# Patient Record
Sex: Male | Born: 1975 | Hispanic: Yes | Marital: Married | Smoking: Former smoker
Health system: Southern US, Community
[De-identification: ages and names within clinical notes are randomized; demographics above are authoritative.]

## PROBLEM LIST (undated history)

## (undated) DIAGNOSIS — I82409 Acute embolism and thrombosis of unspecified deep veins of unspecified lower extremity: Secondary | ICD-10-CM

## (undated) DIAGNOSIS — B019 Varicella without complication: Secondary | ICD-10-CM

## (undated) DIAGNOSIS — E785 Hyperlipidemia, unspecified: Secondary | ICD-10-CM

## (undated) DIAGNOSIS — Z86718 Personal history of other venous thrombosis and embolism: Secondary | ICD-10-CM

## (undated) DIAGNOSIS — Z9289 Personal history of other medical treatment: Secondary | ICD-10-CM

## (undated) DIAGNOSIS — J45909 Unspecified asthma, uncomplicated: Secondary | ICD-10-CM

## (undated) HISTORY — DX: Unspecified asthma, uncomplicated: J45.909

## (undated) HISTORY — DX: Varicella without complication: B01.9

## (undated) HISTORY — DX: Personal history of other venous thrombosis and embolism: Z86.718

## (undated) HISTORY — DX: Personal history of other medical treatment: Z92.89

## (undated) HISTORY — DX: Hyperlipidemia, unspecified: E78.5

## (undated) HISTORY — PX: OTHER SURGICAL HISTORY: SHX169

## (undated) NOTE — ED Provider Notes (Signed)
 Formatting of this note is different from the original.  eMERGENCY dEPARTMENT eNCOUnter    CHIEF COMPLAINT   Chief Complaint  Patient presents with  ? Animal Bite    Sting Ray Bite- happened [place] the ocean,    HPI   Eric Cunningham is a 42 y.o. male who presents with stingray envenomation to the right forearm. Patient was fishing, got a stingray. Patient was hit with tail barb in right forearm while trying to remove the hook. Patient went to 2 different urgent cares, called poison control, and spoke with a park ranger, before coming here. Injury occurred approximately 1.5 hour ago. None tetanus status. Patient is right-hand dominant.  PAST MEDICAL HISTORY   Past Medical History  Diagnosis Date  ? DVT (deep venous thrombosis)    SURGICAL HISTORY   No past surgical history on file.  CURRENT MEDICATIONS   Current Facility-Administered Medications  Medication Dose Route Frequency Provider Last Rate Last Dose  ? oxyCODONE -acetaminophen  (PERCOCET) 5-325 mg per tablet 1 tablet  1 tablet Oral Once Sean P O'Brien, MD      ? TDaP (ADACEL) 2-5-3-5-5 Lf-mcg-Lf/0.67mL injection 0.5 mL  0.5 mL Intramuscular Once Sean P O'Brien, MD       No current outpatient prescriptions on file.   ALLERGIES   Allergies  Allergen Reactions  ? Pcn [Penicillins]    FAMILY HISTORY   No family history on file.  SOCIAL HISTORY   History   Social History  ? Marital Status: N/A    Spouse Name: N/A    Number of Children: N/A  ? Years of Education: N/A   Social History Main Topics  ? Smoking status: Never Smoker   ? Smokeless tobacco: Not on file  ? Alcohol Use: No  ? Drug Use: Yes    Special: Marijuana  ? Sexual Activity: Not on file   Other Topics Concern  ? Not on file   Social History Narrative  ? No narrative on file   family and children are at the bedside. They are here camping  REVIEW OF SYSTEMS   Constitutional:  Denies weakness or lightheadedness Eyes:  Denies visual changes or pain.   HENT:  Denies facial, oral or dental pain or injury. Respiratory:  Denies shortness of breath or cough Cardiovascular:  Denies chest pain  GI:  Denies abdominal pain, nausea or vomiting, or incontinence Musculoskeletal:  Pain in right forearm around site Skin: Puncture wound right forearm Neurologic:  Denies headache, focal weakness or sensory changes.   Psychiatric:  Denies depression, intentional injury, suicidal ideation or homicidal ideation.    See HPI for further details ROS otherwise negative No old records available  PHYSICAL EXAM   VITAL SIGNS: BP 131/76 mmHg  Pulse 68  Temp(Src) 98 F (36.7 C) (Oral)  Resp 12  SpO2 100% Constitutional:  Well-developed well-nourished male, appears uncomfortable Head : Normocephalic/atraumatic Neck: Supple. Trachea midline, no crepitus, no cervical step-offs Eyes:  2+ equal round reactive to light, extraocular muscles appear intact HENT:   No dental/oral inj, airway normal Respiratory:  Clear to auscultation bilaterally, no wheezing Cardiovascular:  regular rhythm without murmurs, rubs or gallops, 2+/2+ rad & DP pulses GI:  NTND, no ecchymosis or abrasions Musculoskeletal:  Full range of motion in the right upper extremity. No bony tenderness. Skin:  warm & dry, normal color, puncture wound right forearm, no surrounding erythema or purulence Neurologic:  GCS 15, no identified CN abnormalities, sensation and motor nl x 4, mood/affect nl  RADIOLOGY  Reviewed by me. See official radiology interpretation. X-ray Radius Ulna Right Ap And Lateral  01/23/2014   TWO VIEW RIGHT FOREARM:  COMPARISON: No comparison.  FINDINGS:  Soft tissue swelling is seen. No radiopaque foreign body is seen. No fracture or dislocation is seen.   01/23/2014   IMPRESSION:  SOFT TISSUE SWELLING  Dictated By: FORBES Alm Sar, MD 01/23/2014 8:55 PM  Electronically Signed by: FORBES Alm Sar, MD 01/23/2014 8:56 PM  PROCEDURES   Hot water immersion, that she  ED COURSE &  MEDICAL DECISION MAKING   Pertinent labs & imaging studies reviewed. (See chart for details) Patient suffered a stingray envenomation. Unfortunately no one told him to soak the area in hot water. Patient was transferred to sink where he could soak the wound. Patient given pain medication. Tetanus updated. X-ray obtained to ensure no foreign bodies retained.  FINAL IMPRESSION   Stingray envenomation  Portions of this note may be dictated using Dragon Naturally Speaking voice recognition software. Variances in spelling and vocabulary are possible and unintentional. Not all errors are caught/corrected. Please notify the dino if any discrepancies are noted or if the meaning of any statement is not clear.  Alyce SHAUNNA Rummer, MD 01/23/14 2127 Electronically signed by Alyce SHAUNNA Rummer, MD at 01/23/2014  9:27 PM EDT

---

## 2008-07-25 DIAGNOSIS — Z86718 Personal history of other venous thrombosis and embolism: Secondary | ICD-10-CM

## 2008-07-25 HISTORY — PX: OTHER SURGICAL HISTORY: SHX169

## 2008-07-25 HISTORY — DX: Personal history of other venous thrombosis and embolism: Z86.718

## 2015-05-28 ENCOUNTER — Inpatient Hospital Stay: Payer: Medicaid Other | Attending: Oncology | Admitting: Oncology

## 2015-05-28 ENCOUNTER — Encounter: Payer: Self-pay | Admitting: Oncology

## 2015-05-28 ENCOUNTER — Inpatient Hospital Stay: Payer: Medicaid Other

## 2015-05-28 VITALS — BP 104/70 | HR 76 | Temp 96.8°F | Resp 16 | Ht 66.93 in | Wt 203.5 lb

## 2015-05-28 DIAGNOSIS — Z86718 Personal history of other venous thrombosis and embolism: Secondary | ICD-10-CM | POA: Insufficient documentation

## 2015-05-28 DIAGNOSIS — F17210 Nicotine dependence, cigarettes, uncomplicated: Secondary | ICD-10-CM | POA: Diagnosis not present

## 2015-05-28 DIAGNOSIS — I82402 Acute embolism and thrombosis of unspecified deep veins of left lower extremity: Secondary | ICD-10-CM

## 2015-05-28 LAB — ANTITHROMBIN III: AntiThromb III Func: 113 % (ref 75–120)

## 2015-05-28 NOTE — Progress Notes (Signed)
Patient had left leg DVT 6 years ago after a long road trip and would like furrther work up, has not had another DVT since.  He does have family history of DVT with his mother and uncle.

## 2015-06-02 LAB — LUPUS ANTICOAGULANT PANEL
DRVVT: 31.5 s (ref 0.0–44.0)
PTT Lupus Anticoagulant: 30.4 s (ref 0.0–40.6)

## 2015-06-02 LAB — CARDIOLIPIN ANTIBODIES, IGG, IGM, IGA
Anticardiolipin IgA: <9 APL U/mL (ref 0–11)
Anticardiolipin IgG: <9 GPL U/mL (ref 0–14)

## 2015-06-02 LAB — BETA-2-GLYCOPROTEIN I ABS, IGG/M/A
Beta-2 Glyco I IgG: <9 GPI IgG units (ref 0–20)
Beta-2-Glycoprotein I IgM: <9 GPI IgM units (ref 0–32)

## 2015-06-02 LAB — PROTEIN S, TOTAL: PROTEIN S AG TOTAL: 72 % (ref 60–150)

## 2015-06-02 LAB — PROTHROMBIN GENE MUTATION

## 2015-06-02 LAB — PROTEIN C ACTIVITY: PROTEIN C ACTIVITY: 142 % (ref 73–180)

## 2015-06-02 LAB — PROTEIN S ACTIVITY: PROTEIN S ACTIVITY: 45 % — AB (ref 63–140)

## 2015-06-02 LAB — FACTOR 5 LEIDEN

## 2015-06-02 LAB — HOMOCYSTEINE: HOMOCYSTEINE-NORM: 9 umol/L (ref 0.0–15.0)

## 2015-06-02 LAB — PROTEIN C, TOTAL: PROTEIN C, TOTAL: 115 % (ref 60–150)

## 2015-06-05 NOTE — Progress Notes (Signed)
[name]  Telephone:([phone]) [phone] Fax:(336) [phone]  ID: Eric Cunningham OB: [birth year]  MR#: [id number]  [id number]  Patient Care Team: Eric Finner, NP as PCP - General (Nurse Practitioner)  CHIEF COMPLAINT:  Chief Complaint  Patient presents with  . New Evaluation    hematology    INTERVAL HISTORY: Patient is a 39 year old male who had a left leg DVT in 2010 in [place] after a long road trip. He required a thrombectomy and received Lovenox and warfarin at that time. He does not recall if he ever had a full hypercoagulable workup. He has had no other DVT or clotting episodes. His only family history is of his mother who had a DVT 1-2 weeks postpartum. Currently, he feels well and is asymptomatic. He offers no specific complaints.   REVIEW OF SYSTEMS:   Review of Systems  Constitutional: Negative for fever, weight loss and malaise/fatigue.  Respiratory: Negative.   Cardiovascular: Negative.   Gastrointestinal: Negative.   Musculoskeletal: Negative.   Neurological: Negative.  Negative for weakness.  Endo/Heme/Allergies: Does not bruise/bleed easily.  Psychiatric/Behavioral: Negative.     As per HPI. Otherwise, a complete review of systems is negatve.  PAST MEDICAL HISTORY: Past Medical History  Diagnosis Date  . History of DVT (deep vein thrombosis) 2010    PAST SURGICAL HISTORY: Past Surgical History  Procedure Laterality Date  . Removal of dvt Left     FAMILY HISTORY No family history on file.     ADVANCED DIRECTIVES:    HEALTH MAINTENANCE: Social History  Substance Use Topics  . Smoking status: Light Tobacco Smoker    Types: Cigars  . Smokeless tobacco: Never Used  . Alcohol Use: Yes     Comment: occasional     Colonoscopy:  PAP:  Bone density:  Lipid panel:  Allergies  Allergen Reactions  . Ativan [Lorazepam]   . Penicillins     No current outpatient prescriptions on file.   No current  facility-administered medications for this visit.    OBJECTIVE: Filed Vitals:   05/28/15 1517  BP: 104/70  Pulse: 76  Temp: 96.8 F (36 C)  Resp: 16     Body mass index is 31.94 kg/(m^2).    ECOG FS:0 - Asymptomatic  General: Well-developed, well-nourished, no acute distress. Eyes: Pink conjunctiva, anicteric sclera. HEENT: Normocephalic, moist mucous membranes, clear oropharnyx. Lungs: Clear to auscultation bilaterally. Heart: Regular rate and rhythm. No rubs, murmurs, or gallops. Abdomen: Soft, nontender, nondistended. No organomegaly noted, normoactive bowel sounds. Musculoskeletal: No edema, cyanosis, or clubbing. Neuro: Alert, answering all questions appropriately. Cranial nerves grossly intact. Skin: No rashes or petechiae noted. Psych: Normal affect. Lymphatics: No cervical, calvicular, axillary or inguinal LAD.   LAB RESULTS:  No results found for: NA, K, CL, CO2, GLUCOSE, BUN, CREATININE, CALCIUM, PROT, ALBUMIN, AST, ALT, ALKPHOS, BILITOT, GFRNONAA, GFRAA  No results found for: WBC, NEUTROABS, HGB, HCT, MCV, PLT   STUDIES: No results found.  ASSESSMENT:  History of DVT.  PLAN:    1. History of DVT: Full hypercoagulable workup is negative. Patient's DVT in 2010 was likely secondary to his extended road trip. His mother's DVT was likely secondary to her pregnancy. No intervention is needed at this time. Patient does not require anticoagulation. No follow-up is necessary.  Patient expressed understanding and was in agreement with this plan. He also understands that He can call clinic at any time with any questions, concerns, or complaints.    Lloyd Huger, MD   06/05/2015  4:51 PM

## 2016-01-16 ENCOUNTER — Emergency Department (HOSPITAL_COMMUNITY): Payer: Self-pay

## 2016-01-16 ENCOUNTER — Emergency Department (HOSPITAL_COMMUNITY)
Admission: EM | Admit: 2016-01-16 | Discharge: 2016-01-16 | Disposition: A | Discharge status: Home / Self Care | Payer: Self-pay | Attending: Emergency Medicine | Admitting: Emergency Medicine

## 2016-01-16 ENCOUNTER — Encounter (HOSPITAL_COMMUNITY): Payer: Self-pay

## 2016-01-16 DIAGNOSIS — Y929 Unspecified place or not applicable: Secondary | ICD-10-CM | POA: Insufficient documentation

## 2016-01-16 DIAGNOSIS — S93402A Sprain of unspecified ligament of left ankle, initial encounter: Secondary | ICD-10-CM | POA: Insufficient documentation

## 2016-01-16 DIAGNOSIS — Z79899 Other long term (current) drug therapy: Secondary | ICD-10-CM | POA: Insufficient documentation

## 2016-01-16 DIAGNOSIS — Y999 Unspecified external cause status: Secondary | ICD-10-CM | POA: Insufficient documentation

## 2016-01-16 DIAGNOSIS — F17210 Nicotine dependence, cigarettes, uncomplicated: Secondary | ICD-10-CM | POA: Insufficient documentation

## 2016-01-16 DIAGNOSIS — X58XXXA Exposure to other specified factors, initial encounter: Secondary | ICD-10-CM | POA: Insufficient documentation

## 2016-01-16 DIAGNOSIS — Y939 Activity, unspecified: Secondary | ICD-10-CM | POA: Insufficient documentation

## 2016-01-16 HISTORY — DX: Acute embolism and thrombosis of unspecified deep veins of unspecified lower extremity: I82.409

## 2016-01-16 MED ORDER — NAPROXEN 500 MG PO TABS: 500.0000 mg | ORAL_TABLET | Freq: Two times a day (BID) | ORAL | Status: DC

## 2016-01-16 MED ORDER — OXYCODONE-ACETAMINOPHEN 5-325 MG PO TABS: 1.0000 | ORAL_TABLET | Freq: Four times a day (QID) | ORAL | Status: DC | PRN

## 2016-01-16 NOTE — ED Provider Notes (Signed)
CSN: [id number]     Arrival date & time 01/16/16  1926 History  By signing my name below, I, Soijett Blue, attest that this documentation has been prepared under the direction and in the presence of Shawn Joy, PA-C Electronically Signed: Soijett Blue, ED Scribe. 01/16/2016. 8:00 PM.   Chief Complaint  Patient presents with  . Ankle Injury      The history is provided by the patient. No language interpreter was used.    Eric Cunningham is a 40 y.o. male who presents to the Emergency Department complaining of left ankle injury occurring PTA. Pt reports that he rolled his ankle while running on the grass in the rain. Pt notes that he heard a "snap" during the incident to his left ankle. Pt states that following the incident, there was pain that he describes as a "numb pain." Pt reports that his left ankle pain is alleviated with sitting still and worsened with weight bearing/ambulation. Pt describes his left ankle pain as dull at this time. Pt is having associated symptoms of left ankle swelling and gait problem due to pain. He notes that he has not tried any medications for the relief of his symptoms. He denies color change, wound, neck pain, back pain, head trauma, and any other symptoms.    Past Medical History  Diagnosis Date  . History of DVT (deep vein thrombosis) 2010  . DVT (deep venous thrombosis) [facility])    Past Surgical History  Procedure Laterality Date  . Removal of dvt Left    No family history on file. Social History  Substance Use Topics  . Smoking status: Light Tobacco Smoker    Types: Cigars  . Smokeless tobacco: Never Used  . Alcohol Use: Yes     Comment: occasional    Review of Systems  Musculoskeletal: Positive for joint swelling (left ankle), arthralgias (left ankle) and gait problem (due to pain). Negative for back pain and neck pain.  Skin: Negative for color change, rash and wound.      Allergies  Ativan and Penicillins  Home Medications   Prior  to Admission medications   Medication Sig Start Date End Date Taking? Authorizing Provider  naproxen (NAPROSYN) 500 MG tablet Take 1 tablet (500 mg total) by mouth 2 (two) times daily. 01/16/16   Shawn C Joy, PA-C  oxyCODONE-acetaminophen (PERCOCET/ROXICET) 5-325 MG tablet Take 1 tablet by mouth every 6 (six) hours as needed for severe pain. 01/16/16   Shawn C Joy, PA-C   BP 136/78 mmHg  Pulse 76  Temp(Src) 98.6 F (37 C) (Oral)  Resp 18  SpO2 98% Physical Exam  Constitutional: He appears well-developed and well-nourished.  HENT:  Head: Normocephalic and atraumatic.  Eyes: Conjunctivae are normal.  Neck: Neck supple.  Cardiovascular: Normal rate and intact distal pulses.   Pulmonary/Chest: Effort normal.  Abdominal: Soft. He exhibits no distension.  Musculoskeletal: Normal range of motion.  Swelling over the left lateral malleolus. Distal pulses intact.  Neurological: He is alert.  No sensory deficits. Strength 5 out of 5.  Skin: Skin is warm and dry.  Psychiatric: He has a normal mood and affect. His behavior is normal.  Nursing note and vitals reviewed.   ED Course  Procedures (including critical care time) DIAGNOSTIC STUDIES: Oxygen Saturation is 98% on RA, nl by my interpretation.    COORDINATION OF CARE: 8:00 PM Discussed treatment plan with pt at bedside which includes left ankle xray, ice, referral to orthopedist, and pt agreed to plan.  Imaging Review Dg Ankle Complete Left  01/16/2016  CLINICAL DATA:  40 year old male with a history of ankle injury EXAM: LEFT ANKLE COMPLETE - 3+ VIEW COMPARISON:  None. FINDINGS: No acute fracture identified. No significant soft tissue swelling. Ankle mortise appears congruent. No evidence of joint effusion on the lateral view. No radiopaque foreign body. IMPRESSION: Negative for acute bony abnormality. Signed, Dulcy Fanny. Earleen Newport, DO [facility] Radiology Electronically Signed   By: Corrie Mckusick D.O.   On: 01/16/2016 20:10   I have personally reviewed and evaluated these images as part of my medical decision-making.   EKG Interpretation None      MDM   Final diagnoses:  Ankle sprain, left, initial encounter    Patient X-Ray negative for fracture or dislocation. Suspect ankle sprain. Pt advised to follow up with orthopedics. Patient given brace and crutches while in ED, conservative therapy recommended and discussed. Patient will be discharged home & is agreeable with above plan. Return precautions discussed. Pt appears safe for discharge.   I personally performed the services described in this documentation, which was scribed in my presence. The recorded information has been reviewed and is accurate.   Lorayne Bender, PA-C 01/16/16 2023  Fredia Sorrow, MD 01/20/16 1651

## 2016-01-16 NOTE — Discharge Instructions (Signed)
You have been seen today for an ankle injury. Your imaging showed no abnormalities. Follow up with orthopedics should symptoms fail to resolve. Refer to the attached literature for home care. Follow up with PCP as needed.

## 2016-01-16 NOTE — ED Notes (Addendum)
Patient here with left ankle pain after rolling same while running in rain. No obvious deformity. Some lateral swelling noted to ankle and top of foot

## 2016-01-16 NOTE — Progress Notes (Signed)
Orthopedic Tech Progress Note Patient Details:  Eric Cunningham 1976/01/09 [id number]  Ortho Devices Type of Ortho Device: ASO, Crutches Ortho Device/Splint Location: LLE ankle Ortho Device/Splint Interventions: Ordered, Application   Braulio Bosch 01/16/2016, 8:35 PM

## 2016-01-16 NOTE — ED Notes (Signed)
Ortho paged and states they are coming.

## 2016-04-23 ENCOUNTER — Inpatient Hospital Stay
Admission: EM | Admit: 2016-04-23 | Discharge: 2016-04-27 | DRG: 254 | Disposition: A | Discharge status: Home / Self Care | Payer: Medicaid Other | Attending: Internal Medicine | Admitting: Internal Medicine

## 2016-04-23 ENCOUNTER — Encounter: Payer: Self-pay | Admitting: Emergency Medicine

## 2016-04-23 ENCOUNTER — Encounter: Payer: Self-pay | Admitting: Gynecology

## 2016-04-23 ENCOUNTER — Ambulatory Visit (INDEPENDENT_AMBULATORY_CARE_PROVIDER_SITE_OTHER)
Admission: EM | Admit: 2016-04-23 | Discharge: 2016-04-23 | Disposition: A | Discharge status: Home / Self Care | Payer: Medicaid Other | Source: Home / Self Care | Attending: Family Medicine | Admitting: Family Medicine

## 2016-04-23 ENCOUNTER — Emergency Department: Payer: Medicaid Other

## 2016-04-23 DIAGNOSIS — I2699 Other pulmonary embolism without acute cor pulmonale: Secondary | ICD-10-CM | POA: Diagnosis not present

## 2016-04-23 DIAGNOSIS — M7989 Other specified soft tissue disorders: Secondary | ICD-10-CM | POA: Diagnosis not present

## 2016-04-23 DIAGNOSIS — M79605 Pain in left leg: Secondary | ICD-10-CM | POA: Diagnosis present

## 2016-04-23 DIAGNOSIS — I82412 Acute embolism and thrombosis of left femoral vein: Secondary | ICD-10-CM

## 2016-04-23 DIAGNOSIS — Z86711 Personal history of pulmonary embolism: Secondary | ICD-10-CM | POA: Diagnosis not present

## 2016-04-23 DIAGNOSIS — Z86718 Personal history of other venous thrombosis and embolism: Secondary | ICD-10-CM

## 2016-04-23 DIAGNOSIS — F17290 Nicotine dependence, other tobacco product, uncomplicated: Secondary | ICD-10-CM | POA: Diagnosis present

## 2016-04-23 DIAGNOSIS — I82402 Acute embolism and thrombosis of unspecified deep veins of left lower extremity: Secondary | ICD-10-CM | POA: Diagnosis not present

## 2016-04-23 DIAGNOSIS — F17210 Nicotine dependence, cigarettes, uncomplicated: Secondary | ICD-10-CM | POA: Diagnosis not present

## 2016-04-23 DIAGNOSIS — I82401 Acute embolism and thrombosis of unspecified deep veins of right lower extremity: Secondary | ICD-10-CM | POA: Diagnosis not present

## 2016-04-23 LAB — BASIC METABOLIC PANEL
ANION GAP: 7 (ref 5–15)
BUN: 16 mg/dL (ref 6–20)
CALCIUM: 9.1 mg/dL (ref 8.9–10.3)
CO2: 23 mmol/L (ref 22–32)
Chloride: 106 mmol/L (ref 101–111)
Creatinine, Ser: 0.67 mg/dL (ref 0.61–1.24)
GLUCOSE: 100 mg/dL — AB (ref 65–99)
Potassium: 3.7 mmol/L (ref 3.5–5.1)
SODIUM: 136 mmol/L (ref 135–145)

## 2016-04-23 LAB — CBC
HEMATOCRIT: 43.5 % (ref 40.0–52.0)
Hemoglobin: 14.7 g/dL (ref 13.0–18.0)
MCH: 29.5 pg (ref 26.0–34.0)
MCHC: 33.8 g/dL (ref 32.0–36.0)
MCV: 87.3 fL (ref 80.0–100.0)
PLATELETS: 271 10*3/uL (ref 150–440)
RBC: 4.99 MIL/uL (ref 4.40–5.90)
RDW: 14.4 % (ref 11.5–14.5)
WBC: 9.2 10*3/uL (ref 3.8–10.6)

## 2016-04-23 LAB — PROTIME-INR
INR: 0.92
Prothrombin Time: 12.4 seconds (ref 11.4–15.2)

## 2016-04-23 LAB — APTT: APTT: 26 s (ref 24–36)

## 2016-04-23 MED ORDER — HEPARIN BOLUS VIA INFUSION
5000.0000 [IU] | Freq: Once | INTRAVENOUS | Status: AC
  Administered 2016-04-23: 5000 [IU] via INTRAVENOUS
  Filled 2016-04-23: qty 5000

## 2016-04-23 MED ORDER — HEPARIN (PORCINE) IN NACL 100-0.45 UNIT/ML-% IJ SOLN
1500.0000 [IU]/h | INTRAMUSCULAR | Status: DC
  Administered 2016-04-23 – 2016-04-25 (×4): 1500 [IU]/h via INTRAVENOUS
  Filled 2016-04-23 (×5): qty 250

## 2016-04-23 MED ORDER — ONDANSETRON HCL 4 MG PO TABS: 4.0000 mg | ORAL_TABLET | Freq: Four times a day (QID) | ORAL | Status: DC | PRN

## 2016-04-23 MED ORDER — ACETAMINOPHEN 650 MG RE SUPP: 650.0000 mg | Freq: Four times a day (QID) | RECTAL | Status: DC | PRN

## 2016-04-23 MED ORDER — HYDROCODONE-ACETAMINOPHEN 5-325 MG PO TABS: 1.0000 | ORAL_TABLET | ORAL | Status: DC | PRN

## 2016-04-23 MED ORDER — SENNOSIDES-DOCUSATE SODIUM 8.6-50 MG PO TABS: 1.0000 | ORAL_TABLET | Freq: Every evening | ORAL | Status: DC | PRN

## 2016-04-23 MED ORDER — ACETAMINOPHEN 325 MG PO TABS: 650.0000 mg | ORAL_TABLET | Freq: Four times a day (QID) | ORAL | Status: DC | PRN

## 2016-04-23 MED ORDER — ONDANSETRON HCL 4 MG/2ML IJ SOLN: 4.0000 mg | Freq: Four times a day (QID) | INTRAMUSCULAR | Status: DC | PRN

## 2016-04-23 MED ORDER — SODIUM CHLORIDE 0.9 % IV SOLN
INTRAVENOUS | Status: DC
  Administered 2016-04-23: 21:00:00 via INTRAVENOUS

## 2016-04-23 MED ORDER — SODIUM CHLORIDE 0.9% FLUSH
3.0000 mL | Freq: Two times a day (BID) | INTRAVENOUS | Status: DC
  Administered 2016-04-23 – 2016-04-27 (×4): 3 mL via INTRAVENOUS

## 2016-04-23 NOTE — ED Triage Notes (Signed)
Patient c/o DVT in left leg.

## 2016-04-23 NOTE — ED Triage Notes (Signed)
Pt c/o pain and swelling to left leg X 2 days. Hx DVT. Travel to [place] within last week. Denies CP/SHOB

## 2016-04-23 NOTE — ED Notes (Signed)
Report given to floor RN. Pt taken to floor via stretcher. Vital signs stable prior to transport.  

## 2016-04-23 NOTE — ED Provider Notes (Signed)
[facility] Emergency Department Provider Note   ____________________________________________    I have reviewed the triage vital signs and the nursing notes.   HISTORY  Chief Complaint Leg Swelling     HPI Eric Cunningham is a 40 y.o. male who presents with complaints of left leg swelling and pain. Patient reports recent trip to [place] where he drove 10 hours there and back. He developed swelling and discomfort behind his left knee since thereafter. Patient has a history of a DVT 8 years ago which required thrombectomy hospital stay in ICU stay. He was never on Coumadin afterwards because of insurance issues. He denies shortness of breath. Denies pleurisy. Denies chest pain. Denies discoloration of his leg.   Past Medical History:  Diagnosis Date  . DVT (deep venous thrombosis) ([place])   . History of DVT (deep vein thrombosis) 2010    Patient Active Problem List   Diagnosis Date Noted  . DVT (deep vein thrombosis) in pregnancy 04/23/2016    Past Surgical History:  Procedure Laterality Date  . removal of dvt Left     Prior to Admission medications   Not on File     Allergies Ativan [lorazepam] and Penicillins  History reviewed. No pertinent family history.  Social History Social History  Substance Use Topics  . Smoking status: Light Tobacco Smoker    Types: Cigars  . Smokeless tobacco: Never Used  . Alcohol use Yes     Comment: occasional    Review of Systems  Constitutional: No fever/chills  Cardiovascular: Denies chest pain. Respiratory: Denies shortness of breath.No pleurisy  Gastrointestinal:   No nausea, no vomiting.    Musculoskeletal: Leg pain as above Skin: Negative for pallor Neurological: Negative for headaches   10-point ROS otherwise negative.  ____________________________________________   PHYSICAL EXAM:  VITAL SIGNS: ED Triage Vitals  Enc Vitals Group     BP 04/23/16 1722 129/82     Pulse  Rate 04/23/16 1722 75     Resp 04/23/16 1722 18     Temp 04/23/16 1722 98.1 F (36.7 C)     Temp Source 04/23/16 1722 Oral     SpO2 04/23/16 1722 96 %     Weight 04/23/16 1723 207 lb (93.9 kg)     Height 04/23/16 1723 5\' 6"  (1.676 m)     Head Circumference --      Peak Flow --      Pain Score 04/23/16 1723 3     Pain Loc --      Pain Edu? --      Excl. in [place]? --     Constitutional: Alert and oriented. No acute distress. Pleasant and interactive   Nose: No congestion/rhinnorhea. Mouth/Throat: Mucous membranes are moist.    Cardiovascular: Normal rate, regular rhythm. Grossly normal heart sounds.  Good peripheral circulation. Respiratory: Normal respiratory effort.  No retractions. Lungs CTAB. Gastrointestinal: Soft and nontender. No distention.  No CVA tenderness. Genitourinary: deferred Musculoskeletal: 2+ distal pulses in the lower extremities. Warm and well perfused. No pallor or discoloration, minimal swelling in the ankle Neurologic:  Normal speech and language. No gross focal neurologic deficits are appreciated.  Skin:  Skin is warm, dry and intact. No rash noted. Psychiatric: Mood and affect are normal. Speech and behavior are normal.  ____________________________________________   LABS (all labs ordered are listed, but only abnormal results are displayed)  Labs Reviewed  CBC  BASIC METABOLIC PANEL  PROTIME-INR  APTT   ____________________________________________  EKG  None ____________________________________________  RADIOLOGY  Significant DVT in the left leg ____________________________________________   PROCEDURES  Procedure(s) performed: No    Critical Care performed: No ____________________________________________   INITIAL IMPRESSION / ASSESSMENT AND PLAN / ED COURSE  Pertinent labs & imaging results that were available during my care of the patient were reviewed by me and considered in my medical decision making (see chart for  details).  Discussed with Dr. Delana Meyer of vascular surgery, he recommends starting heparin drip and admitted to the hospital as he feels the patient will likely require procedure given his age and history. Discussed this with the patient who understands the plan.  Clinical Course   ____________________________________________   FINAL CLINICAL IMPRESSION(S) / ED DIAGNOSES  Final diagnoses:  Acute deep vein thrombosis (DVT) of femoral vein of left lower extremity (HCC)      NEW MEDICATIONS STARTED DURING THIS VISIT:  New Prescriptions   No medications on file     Note:  This document was prepared using Dragon voice recognition software and may include unintentional dictation errors.    Lavonia Drafts, MD 04/23/16 [phone]

## 2016-04-23 NOTE — H&P (Signed)
[facility] NAME: Eric Cunningham    MR#:  [id number]  DATE OF BIRTH:  1975-11-26  DATE OF ADMISSION:  04/23/2016  PRIMARY CARE PHYSICIAN: Freddy Finner, NP   REQUESTING/REFERRING PHYSICIAN: Dr Corky Downs     CHIEF COMPLAINT:   Left leg pain HISTORY OF PRESENT ILLNESS:  Eric Cunningham  is a 40 y.o. male with a known history of DVT 8 years ago who presents with left lower external he pain. Patient reports that he was on a [place] over the weekend and subsequently has developed pain and swelling of the left lower external he. In the emergency room Doppler shows extensive DVT. Vascular surgery was consulted who is planning for thrombectomy early this week. They have recommended heparin drip for now. Patient had thrombectomy for his last DVT as well. PAST MEDICAL HISTORY:   Past Medical History:  Diagnosis Date  . DVT (deep venous thrombosis) ([place])   . History of DVT (deep vein thrombosis) 2010    PAST SURGICAL HISTORY:   Past Surgical History:  Procedure Laterality Date  . removal of dvt Left     SOCIAL HISTORY:   Social History  Substance Use Topics  . Smoking status: Light Tobacco Smoker    Types: Cigars  . Smokeless tobacco: Never Used  . Alcohol use Yes     Comment: occasional    FAMILY HISTORY:  Mother has history of DVT after pregnancy  DRUG ALLERGIES:   Allergies  Allergen Reactions  . Ativan [Lorazepam]   . Penicillins     REVIEW OF SYSTEMS:   Review of Systems  Constitutional: Negative.  Negative for chills, fever and malaise/fatigue.  HENT: Negative.  Negative for ear discharge, ear pain, hearing loss, nosebleeds and sore throat.   Eyes: Negative.  Negative for blurred vision and pain.  Respiratory: Negative.  Negative for cough, hemoptysis, shortness of breath and wheezing.   Cardiovascular: Positive for leg swelling. Negative for chest pain and palpitations.  Gastrointestinal:  Negative.  Negative for abdominal pain, blood in stool, diarrhea, nausea and vomiting.  Genitourinary: Negative.  Negative for dysuria.  Musculoskeletal: Negative.  Negative for back pain.  Skin: Negative.   Neurological: Negative for dizziness, tremors, speech change, focal weakness, seizures and headaches.  Endo/Heme/Allergies: Negative.  Does not bruise/bleed easily.  Psychiatric/Behavioral: Negative.  Negative for depression, hallucinations and suicidal ideas.    MEDICATIONS AT HOME:   Prior to Admission medications   Not on File      VITAL SIGNS:  Blood pressure 125/83, pulse 75, temperature 98.1 F (36.7 C), temperature source Oral, resp. rate 17, height 5\' 6"  (1.676 m), weight 93.9 kg (207 lb), SpO2 96 %.  PHYSICAL EXAMINATION:   Physical Exam  Constitutional: He is oriented to person, place, and time and well-developed, well-nourished, and in no distress. No distress.  HENT:  Head: Normocephalic.  Eyes: No scleral icterus.  Neck: Normal range of motion. Neck supple. No JVD present. No tracheal deviation present.  Cardiovascular: Normal rate, regular rhythm and normal heart sounds.  Exam reveals no gallop and no friction rub.   No murmur heard. Pulmonary/Chest: Effort normal and breath sounds normal. No respiratory distress. He has no wheezes. He has no rales. He exhibits no tenderness.  Abdominal: Soft. Bowel sounds are normal. He exhibits no distension and no mass. There is no tenderness. There is no rebound and no guarding.  Musculoskeletal: Normal range of motion. He exhibits edema.  Positive Homans sign left leg  Neurological: He is alert and oriented to person, place, and time.  Skin: Skin is warm. No rash noted. No erythema.  Psychiatric: Affect and judgment normal.      LABORATORY PANEL:   CBC  Recent Labs Lab 04/23/16 1859  WBC 9.2  HGB 14.7  HCT 43.5  PLT 271    ------------------------------------------------------------------------------------------------------------------  Chemistries  No results for input(s): NA, K, CL, CO2, GLUCOSE, BUN, CREATININE, CALCIUM, MG, AST, ALT, ALKPHOS, BILITOT in the last 168 hours.  Invalid input(s): GFRCGP ------------------------------------------------------------------------------------------------------------------  Cardiac Enzymes No results for input(s): TROPONINI in the last 168 hours. ------------------------------------------------------------------------------------------------------------------  RADIOLOGY:  US Venous Img Lower Unilateral Left  Result Date: 04/23/2016 CLINICAL DATA:  Acute onset of left leg swelling and pain. Initial encounter. EXAM: LEFT LOWER EXTREMITY VENOUS DOPPLER ULTRASOUND TECHNIQUE: Gray-scale sonography with graded compression, as well as color Doppler and duplex ultrasound were performed to evaluate the lower extremity deep venous systems from the level of the common femoral vein and including the common femoral, femoral, profunda femoral, popliteal and calf veins including the posterior tibial, peroneal and gastrocnemius veins when visible. The superficial great saphenous vein was also interrogated. Spectral Doppler was utilized to evaluate flow at rest and with distal augmentation maneuvers in the common femoral, femoral and popliteal veins. COMPARISON:  None. FINDINGS: Contralateral Common Femoral Vein: Respiratory phasicity is normal and symmetric with the symptomatic side. No evidence of thrombus. Normal compressibility. Common Femoral Vein: Nonocclusive thrombus is noted within the distal left common femoral vein, just proximal to its origin. Saphenofemoral Junction: No evidence of thrombus. Normal compressibility and flow on color Doppler imaging. Profunda Femoral Vein: No evidence of thrombus. Normal compressibility and flow on color Doppler imaging. Femoral Vein: Occlusive  thrombus is noted within the left femoral vein. Popliteal Vein: Occlusive thrombus is noted within the left popliteal vein. Calf Veins: Occlusive thrombus is noted within the visualized posterior tibial vein and one of the peroneal veins. The more posterior peroneal vein appears patent. Superficial Great Saphenous Vein: No evidence of thrombus. Normal compressibility and flow on color Doppler imaging. Venous Reflux:  None. Other Findings:  None. IMPRESSION: Occlusive deep venous thrombosis noted within the left femoral vein, left popliteal vein and calf veins. Nonocclusive thrombus noted within the distal left common femoral vein. These results were called by telephone at the time of interpretation on 04/23/2016 at 6:34 pm to Dr. Lavonia Drafts, who verbally acknowledged these results. Electronically Signed   By: Garald Balding M.D.   On: 04/23/2016 18:35    EKG:  No orders found for this or any previous visit.  IMPRESSION AND PLAN:   40 year old male with a history of DVT status post thrombectomy 8-10 years ago who was in a 10 hour car ride who presents with left lower extremity swelling and found to have an occlusive DVT  1. Occlusive DVT noted within the left femoral vein, left popliteal vein and calf veins: Patient will need to undergo thrombectomy. Heparin drip started. Vascular surgery consulted. Pain medications when necessary. Patient has been evaluated by oncology in the past.  2. Tobacco dependence: Patient is highly encouraged to stop smoking. Patient counseled 3 minutes.  All the records are reviewed and case discussed with ED provider. Management plans discussed with the patient and he in agreement  CODE STATUS: full  TOTAL TIME TAKING CARE OF THIS PATIENT: 50 minutes.    Cindel Daugherty M.D on 04/23/2016 at 7:22 PM  Between 7am to 6pm -  Pager - [phone]  After 6pm go to [email or web address] - password [email or web address] [facility]  Office  [phone]  CC: Primary  care physician; Freddy Finner, NP

## 2016-04-23 NOTE — Progress Notes (Signed)
ANTICOAGULATION CONSULT NOTE - Initial Consult  Pharmacy Consult for Heparin Drip Indication: DVT  Allergies  Allergen Reactions  . Ativan [Lorazepam] Other (See Comments)    Patient states he was very anxious and very hyper.  . Penicillins Other (See Comments)    Patient states his mother said he was allergic at birth. Unknown reation    Patient Measurements: Height: 5\' 6"  (167.6 cm) Weight: 207 lb (93.9 kg) IBW/kg (Calculated) : 63.8 Heparin Dosing Weight: 84kg  Vital Signs: Temp: 98.1 F (36.7 C) (09/30 1722) Temp Source: Oral (09/30 1722) BP: 132/90 (09/30 2000) Pulse Rate: 73 (09/30 2000)  Labs:  Recent Labs  04/23/16 1859 04/23/16 1922  HGB 14.7  --   HCT 43.5  --   PLT 271  --   APTT  --  26  LABPROT  --  12.4  INR  --  0.92  CREATININE  --  0.67    Estimated Creatinine Clearance: 130.3 mL/min (by C-G formula based on SCr of 0.67 mg/dL).   Medical History: Past Medical History:  Diagnosis Date  . DVT (deep venous thrombosis) ([place])   . History of DVT (deep vein thrombosis) 2010    Medications:  Scheduled:   Infusions:  . heparin 1,500 Units/hr (04/23/16 1947)    Assessment: 40 y.o. male who presents with complaints of left leg swelling and pain. Patient has a history of a DVT 8 years ago which required thrombectomy hospital stay in ICU.  Pharmacy consulted to dose and monitor heparin drip for acute DVT of femoral vein of LLE.  Goal of Therapy:  Heparin level 0.3-0.7 units/ml Monitor platelets by anticoagulation protocol: Yes   Plan:  Give 5000 units bolus x 1 Start heparin infusion at 1500 units/hr Check anti-Xa level in 6 hours and daily while on heparin Continue to monitor H&H and platelets   HL ordered for 10/1 at 02:00.  Olivia Canter, [facility] Clinical Pharmacist 04/23/2016,8:25 PM

## 2016-04-23 NOTE — ED Notes (Signed)
Pt drove to [place] and back this past weekend..10 hour drive both ways. Pt started to have ankle swelling on Wednesday, but noticed that calf was swollen yesterday. Pt has hx of DVT about 10 years ago, had to have surgery and was hospitalized for several days, including a stay in the ICU. Pt took one baby ASA today.
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
## 2016-04-24 DIAGNOSIS — F17210 Nicotine dependence, cigarettes, uncomplicated: Secondary | ICD-10-CM

## 2016-04-24 DIAGNOSIS — I82409 Acute embolism and thrombosis of unspecified deep veins of unspecified lower extremity: Secondary | ICD-10-CM

## 2016-04-24 DIAGNOSIS — M79605 Pain in left leg: Secondary | ICD-10-CM

## 2016-04-24 LAB — CBC
HEMATOCRIT: 40.3 % (ref 40.0–52.0)
HEMOGLOBIN: 13.6 g/dL (ref 13.0–18.0)
MCH: 30.1 pg (ref 26.0–34.0)
MCHC: 33.8 g/dL (ref 32.0–36.0)
MCV: 89 fL (ref 80.0–100.0)
Platelets: 229 10*3/uL (ref 150–440)
RBC: 4.53 MIL/uL (ref 4.40–5.90)
RDW: 14 % (ref 11.5–14.5)
WBC: 8.9 10*3/uL (ref 3.8–10.6)

## 2016-04-24 LAB — HEPARIN LEVEL (UNFRACTIONATED)
HEPARIN UNFRACTIONATED: 0.7 [IU]/mL (ref 0.30–0.70)
HEPARIN UNFRACTIONATED: 0.65 [IU]/mL (ref 0.30–0.70)

## 2016-04-24 NOTE — Consult Note (Signed)
[facility] Vascular Consult Note  MRN : [id number]  Eric Cunningham is a 40 y.o. (1975/10/20) male who presents with chief complaint of  Chief Complaint  Patient presents with  . Leg Swelling   History of Present Illness:  [name]  is a 40 year old male with no known past medical history with the exception of a DVT eight years ago who presented to the [facility] ED with left lower external he pain. Endorses a history of past DVT for which he underwent three days of "treatment" and was in the ICU. He was placed on Coumadin after. States he saw a hematologist at one point - stated he may have clotting disorder however was never worked up for it.   Patient reports he was on a ten hour car ride from [place] over the weekend and subsequently developed pain and swelling of the left lower extremity. A workup in the emergency room including a venous duplex was notable for an occlusive deep venous thrombosis noted within the left femoral vein, left popliteal vein and calf veins. Nonocclusive thrombus noted within the distal left common femoral vein.  He was started on heparin and admitted to the hospital. States since he has been on heparin his pain has improved slightly. Denies any fever, nausea or vomiting. Denies any SOB or chest pain.  Vascular surgery was consulted by Dr. Vianne Bulls for possible venous lysis.   Current Facility-Administered Medications  Medication Dose Route Frequency Provider Last Rate Last Dose  . acetaminophen (TYLENOL) tablet 650 mg  650 mg Oral Q6H PRN Bettey Costa, MD       Or  . acetaminophen (TYLENOL) suppository 650 mg  650 mg Rectal Q6H PRN Bettey Costa, MD      . heparin ADULT infusion 100 units/mL (25000 units/232mL sodium chloride 0.45%)  1,500 Units/hr Intravenous Continuous Lavonia Drafts, MD 15 mL/hr at 04/24/16 0918 1,500 Units/hr at 04/24/16 0918  . HYDROcodone-acetaminophen (NORCO/VICODIN) 5-325 MG per tablet 1-2 tablet  1-2 tablet Oral  Q4H PRN Bettey Costa, MD      . ondansetron (ZOFRAN) tablet 4 mg  4 mg Oral Q6H PRN Bettey Costa, MD       Or  . ondansetron (ZOFRAN) injection 4 mg  4 mg Intravenous Q6H PRN Bettey Costa, MD      . senna-docusate (Senokot-S) tablet 1 tablet  1 tablet Oral QHS PRN Bettey Costa, MD      . sodium chloride flush (NS) 0.9 % injection 3 mL  3 mL Intravenous Q12H Bettey Costa, MD   3 mL at 04/23/16 2113   Past Medical History:  Diagnosis Date  . DVT (deep venous thrombosis) ([place])   . History of DVT (deep vein thrombosis) 2010   Past Surgical History:  Procedure Laterality Date  . removal of dvt Left    Social History Social History  Substance Use Topics  . Smoking status: Light Tobacco Smoker    Types: Cigars  . Smokeless tobacco: Never Used  . Alcohol use Yes     Comment: occasional   Family History History reviewed. No pertinent family history.  Denies any family history of PAD, Venous or Renal Disease.   Allergies  Allergen Reactions  . Ativan [Lorazepam] Other (See Comments)    Patient states he was very anxious and very hyper.  . Penicillins Other (See Comments)    Patient states his mother said he was allergic at birth. Unknown reation   REVIEW OF SYSTEMS (Negative unless checked)  Constitutional: [] Weight loss  [] Fever  [] Chills Cardiac: [] Chest pain   [] Chest pressure   [] Palpitations   [] Shortness of breath when laying flat   [] Shortness of breath at rest   [] Shortness of breath with exertion. Vascular:  [x] Pain in legs with walking   [x] Pain in legs at rest   [x] Pain in legs when laying flat   [] Claudication   [] Pain in feet when walking  [] Pain in feet at rest  [] Pain in feet when laying flat   [x] History of DVT   [] Phlebitis   [x] Swelling in legs   [] Varicose veins   [] Non-healing ulcers Pulmonary:   [] Uses home oxygen   [] Productive cough   [] Hemoptysis   [] Wheeze  [] COPD   [] Asthma Neurologic:  [] Dizziness  [] Blackouts   [] Seizures   [] History of stroke   [] History of TIA   [] Aphasia   [] Temporary blindness   [] Dysphagia   [] Weakness or numbness in arms   [] Weakness or numbness in legs Musculoskeletal:  [] Arthritis   [] Joint swelling   [] Joint pain   [] Low back pain Hematologic:  [] Easy bruising  [] Easy bleeding   [x] Hypercoagulable state   [] Anemic  [] Hepatitis Gastrointestinal:  [] Blood in stool   [] Vomiting blood  [] Gastroesophageal reflux/heartburn   [] Difficulty swallowing. Genitourinary:  [] Chronic kidney disease   [] Difficult urination  [] Frequent urination  [] Burning with urination   [] Blood in urine Skin:  [] Rashes   [] Ulcers   [] Wounds Psychological:  [] History of anxiety   []  History of major depression.  Physical Examination  Vitals:   04/23/16 1930 04/23/16 2000 04/23/16 2036 04/24/16 0440  BP: (!) 130/95 132/90 139/77 107/66  Pulse: 76 73 63 74  Resp: 18 15 18 16   Temp:   98.3 F (36.8 C) 98.2 F (36.8 C)  TempSrc:   Oral Oral  SpO2: 98% 97% 100% 98%  Weight:      Height:       Body mass index is 33.41 kg/m. Gen:  WD/WN, NAD Head: [place]/AT, No temporalis wasting. Prominent temp pulse not noted. Ear/Nose/Throat: Hearing grossly intact, nares w/o erythema or drainage, oropharynx w/o Erythema/Exudate Eyes: PERRLA, EOMI.  Neck: Supple, no nuchal rigidity.  No bruit or JVD.  Pulmonary:  Good air movement, clear to auscultation bilaterally.  Cardiac: RRR, normal S1, S2, no Murmurs, rubs or gallops. Vascular:  Vessel Right Left  Radial Palpable Palpable  Ulnar Palpable Palpable  Brachial Palpable Palpable  Carotid Palpable, without bruit Palpable, without bruit  Aorta Not palpable N/A  Femoral Palpable Palpable  Popliteal Palpable Palpable  PT Palpable Palpable  DP Palpable Palpable   Left Lower Extremity: Moderate edema noted. Minimally tender to palpation. No pain with dorsiflexion. No acute vascular compromise.   Gastrointestinal: soft, non-tender/non-distended. No guarding/reflex. No masses, surgical incisions, or  scars. Musculoskeletal: M/S 5/5 throughout.  Extremities without ischemic changes.  No deformity or atrophy. No edema. Neurologic: CN 2-12 intact. Pain and light touch intact in extremities.  Symmetrical.  Speech is fluent. Motor exam as listed above. Psychiatric: Judgment intact, Mood & affect appropriate for pt's clinical situation. Dermatologic: No rashes or ulcers noted.  No cellulitis or open wounds. Lymph : No Cervical, Axillary, or Inguinal lymphadenopathy.  CBC Lab Results  Component Value Date   WBC 8.9 04/24/2016   HGB 13.6 04/24/2016   HCT 40.3 04/24/2016   MCV 89.0 04/24/2016   PLT 229 04/24/2016   BMET    Component Value Date/Time   NA 136 04/23/2016 1922   K 3.7 04/23/2016 1922  CL 106 04/23/2016 1922   CO2 23 04/23/2016 1922   GLUCOSE 100 (H) 04/23/2016 1922   BUN 16 04/23/2016 1922   CREATININE 0.67 04/23/2016 1922   CALCIUM 9.1 04/23/2016 1922   GFRNONAA >60 04/23/2016 1922   GFRAA >60 04/23/2016 1922   Estimated Creatinine Clearance: 130.3 mL/min (by C-G formula based on SCr of 0.67 mg/dL).  COAG Lab Results  Component Value Date   INR 0.92 04/23/2016   Radiology US Venous Img Lower Unilateral Left  Result Date: 04/23/2016 CLINICAL DATA:  Acute onset of left leg swelling and pain. Initial encounter. EXAM: LEFT LOWER EXTREMITY VENOUS DOPPLER ULTRASOUND TECHNIQUE: Gray-scale sonography with graded compression, as well as color Doppler and duplex ultrasound were performed to evaluate the lower extremity deep venous systems from the level of the common femoral vein and including the common femoral, femoral, profunda femoral, popliteal and calf veins including the posterior tibial, peroneal and gastrocnemius veins when visible. The superficial great saphenous vein was also interrogated. Spectral Doppler was utilized to evaluate flow at rest and with distal augmentation maneuvers in the common femoral, femoral and popliteal veins. COMPARISON:  None. FINDINGS:  Contralateral Common Femoral Vein: Respiratory phasicity is normal and symmetric with the symptomatic side. No evidence of thrombus. Normal compressibility. Common Femoral Vein: Nonocclusive thrombus is noted within the distal left common femoral vein, just proximal to its origin. Saphenofemoral Junction: No evidence of thrombus. Normal compressibility and flow on color Doppler imaging. Profunda Femoral Vein: No evidence of thrombus. Normal compressibility and flow on color Doppler imaging. Femoral Vein: Occlusive thrombus is noted within the left femoral vein. Popliteal Vein: Occlusive thrombus is noted within the left popliteal vein. Calf Veins: Occlusive thrombus is noted within the visualized posterior tibial vein and one of the peroneal veins. The more posterior peroneal vein appears patent. Superficial Great Saphenous Vein: No evidence of thrombus. Normal compressibility and flow on color Doppler imaging. Venous Reflux:  None. Other Findings:  None. IMPRESSION: Occlusive deep venous thrombosis noted within the left femoral vein, left popliteal vein and calf veins. Nonocclusive thrombus noted within the distal left common femoral vein. These results were called by telephone at the time of interpretation on 04/23/2016 at 6:34 pm to Dr. Lavonia Drafts, who verbally acknowledged these results. Electronically Signed   By: Garald Balding M.D.   On: 04/23/2016 18:35   Assessment/Plan 1) Acute DVT: Continue heparin. Recommend venous lysis due to extent of DVT. This will be planned for Tuesday. 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 Would recommend follow up as outpatient to rule out any clotting disorders as this is a second unprovoked DVT.  3) Tobacco Dependence: had long conversation (over 15 min) about absolute need for tobacco cessation. Discussed detrimental effects on body especially vascular system. He expresses his understanding. 4) Discussed with Dr. Francene Castle, PA-C  04/24/2016 12:38 PM

## 2016-04-24 NOTE — Progress Notes (Signed)
ANTICOAGULATION CONSULT NOTE - Initial Consult  Pharmacy Consult for Heparin Drip Indication: DVT  Allergies  Allergen Reactions  . Ativan [Lorazepam] Other (See Comments)    Patient states he was very anxious and very hyper.  . Penicillins Other (See Comments)    Patient states his mother said he was allergic at birth. Unknown reation    Patient Measurements: Height: 5\' 6"  (167.6 cm) Weight: 207 lb (93.9 kg) IBW/kg (Calculated) : 63.8 Heparin Dosing Weight: 84kg  Vital Signs: Temp: 98.3 F (36.8 C) (09/30 2036) Temp Source: Oral (09/30 2036) BP: 139/77 (09/30 2036) Pulse Rate: 63 (09/30 2036)  Labs:  Recent Labs  04/23/16 1859 04/23/16 1922 04/24/16 0209  HGB 14.7  --  13.6  HCT 43.5  --  40.3  PLT 271  --  229  APTT  --  26  --   LABPROT  --  12.4  --   INR  --  0.92  --   HEPARINUNFRC  --   --  0.70  CREATININE  --  0.67  --     Estimated Creatinine Clearance: 130.3 mL/min (by C-G formula based on SCr of 0.67 mg/dL).   Medical History: Past Medical History:  Diagnosis Date  . DVT (deep venous thrombosis) ([place])   . History of DVT (deep vein thrombosis) 2010    Medications:  Scheduled:  . sodium chloride flush  3 mL Intravenous Q12H   Infusions:  . sodium chloride 75 mL/hr at 04/23/16 2108  . heparin 1,500 Units/hr (04/23/16 1947)    Assessment: 40 y.o. male who presents with complaints of left leg swelling and pain. Patient has a history of a DVT 8 years ago which required thrombectomy hospital stay in ICU.  Pharmacy consulted to dose and monitor heparin drip for acute DVT of femoral vein of LLE.  Goal of Therapy:  Heparin level 0.3-0.7 units/ml Monitor platelets by anticoagulation protocol: Yes   Plan:  Give 5000 units bolus x 1 Start heparin infusion at 1500 units/hr Check anti-Xa level in 6 hours and daily while on heparin Continue to monitor H&H and platelets   10/1 0209 first heparin level therapeutic. Continue current rate. Recheck  HL in 6 hours.  Laural Benes, Pharm.D., BCPS Clinical Pharmacist 04/24/2016,2:52 AM

## 2016-04-24 NOTE — Progress Notes (Signed)
[place] for Heparin Drip Indication: DVT  Allergies  Allergen Reactions  . Ativan [Lorazepam] Other (See Comments)    Patient states he was very anxious and very hyper.  . Penicillins Other (See Comments)    Patient states his mother said he was allergic at birth. Unknown reation    Patient Measurements: Height: 5\' 6"  (167.6 cm) Weight: 207 lb (93.9 kg) IBW/kg (Calculated) : 63.8 Heparin Dosing Weight: 84kg  Vital Signs: Temp: 98.2 F (36.8 C) (10/01 0440) Temp Source: Oral (10/01 0440) BP: 107/66 (10/01 0440) Pulse Rate: 74 (10/01 0440)  Labs:  Recent Labs  04/23/16 1859 04/23/16 1922 04/24/16 0209 04/24/16 0744  HGB 14.7  --  13.6  --   HCT 43.5  --  40.3  --   PLT 271  --  229  --   APTT  --  26  --   --   LABPROT  --  12.4  --   --   INR  --  0.92  --   --   HEPARINUNFRC  --   --  0.70 0.65  CREATININE  --  0.67  --   --     Estimated Creatinine Clearance: 130.3 mL/min (by C-G formula based on SCr of 0.67 mg/dL).   Medical History: Past Medical History:  Diagnosis Date  . DVT (deep venous thrombosis) ([place])   . History of DVT (deep vein thrombosis) 2010    Medications:  Scheduled:  . sodium chloride flush  3 mL Intravenous Q12H   Infusions:  . heparin 1,500 Units/hr (04/24/16 [id number])    Assessment: 40 y.o. male who presents with complaints of left leg swelling and pain. Patient has a history of a DVT 8 years ago which required thrombectomy hospital stay in ICU. Pharmacy consulted to dose and monitor heparin drip for acute DVT of femoral vein of LLE. Patient currently ordered heparin 1500 units/hr.   Goal of Therapy:  Heparin level 0.3-0.7 units/ml Monitor platelets by anticoagulation protocol: Yes   Plan:  Patient has anti-Xa levels in range x 2. Will obtain follow up labs in am.   Pharmacy will continue to monitor and adjust per consult.   Simpson,Michael L 04/24/2016,10:17 AM

## 2016-04-24 NOTE — Progress Notes (Signed)
[place] [facility] [place] NAME: Eric Cunningham    MR#:  [id number]  DATE OF BIRTH:  1976/03/30  SUBJECTIVE: Admitted for  Extensive left leg DVT. Today he feels better. No leg pain or shortness of breath.   CHIEF COMPLAINT:   Chief Complaint  Patient presents with  . Leg Swelling    REVIEW OF SYSTEMS:    Review of Systems  Constitutional: Negative for chills and fever.  HENT: Negative for hearing loss.   Eyes: Negative for blurred vision, double vision and photophobia.  Respiratory: Negative for cough, hemoptysis and shortness of breath.   Cardiovascular: Negative for palpitations, orthopnea and leg swelling.  Gastrointestinal: Negative for abdominal pain, diarrhea and vomiting.  Genitourinary: Negative for dysuria and urgency.  Musculoskeletal: Negative for myalgias and neck pain.  Skin: Negative for rash.  Neurological: Negative for dizziness, focal weakness, seizures, weakness and headaches.  Psychiatric/Behavioral: Negative for memory loss. The patient does not have insomnia.     Nutrition:  Tolerating Diet: Tolerating PT:      DRUG ALLERGIES:   Allergies  Allergen Reactions  . Ativan [Lorazepam] Other (See Comments)    Patient states he was very anxious and very hyper.  . Penicillins Other (See Comments)    Patient states his mother said he was allergic at birth. Unknown reation    VITALS:  Blood pressure 107/66, pulse 74, temperature 98.2 F (36.8 C), temperature source Oral, resp. rate 16, height 5\' 6"  (1.676 m), weight 93.9 kg (207 lb), SpO2 98 %.  PHYSICAL EXAMINATION:   Physical Exam  GENERAL:  40 y.o.-year-old patient lying in the bed with no acute distress.  EYES: Pupils equal, round, reactive to light and accommodation. No scleral icterus. Extraocular muscles intact.  HEENT: Head atraumatic, normocephalic. Oropharynx and nasopharynx clear.  NECK:  Supple, no jugular venous distention. No thyroid  enlargement, no tenderness.  LUNGS: Normal breath sounds bilaterally, no wheezing, rales,rhonchi or crepitation. No use of accessory muscles of respiration.  CARDIOVASCULAR: S1, S2 normal. No murmurs, rubs, or gallops.  ABDOMEN: Soft, nontender, nondistended. Bowel sounds present. No organomegaly or mass.  EXTREMITIES: No pedal edema, cyanosis, or clubbing.  NEUROLOGIC: Cranial nerves II through XII are intact. Muscle strength 5/5 in all extremities. Sensation intact. Gait not checked.  PSYCHIATRIC: The patient is alert and oriented x 3.  SKIN: No obvious rash, lesion, or ulcer.    LABORATORY PANEL:   CBC  Recent Labs Lab 04/24/16 0209  WBC 8.9  HGB 13.6  HCT 40.3  PLT 229   ------------------------------------------------------------------------------------------------------------------  Chemistries   Recent Labs Lab 04/23/16 1922  NA 136  K 3.7  CL 106  CO2 23  GLUCOSE 100*  BUN 16  CREATININE 0.67  CALCIUM 9.1   ------------------------------------------------------------------------------------------------------------------  Cardiac Enzymes No results for input(s): TROPONINI in the last 168 hours. ------------------------------------------------------------------------------------------------------------------  RADIOLOGY:  US Venous Img Lower Unilateral Left  Result Date: 04/23/2016 CLINICAL DATA:  Acute onset of left leg swelling and pain. Initial encounter. EXAM: LEFT LOWER EXTREMITY VENOUS DOPPLER ULTRASOUND TECHNIQUE: Gray-scale sonography with graded compression, as well as color Doppler and duplex ultrasound were performed to evaluate the lower extremity deep venous systems from the level of the common femoral vein and including the common femoral, femoral, profunda femoral, popliteal and calf veins including the posterior tibial, peroneal and gastrocnemius veins when visible. The superficial great saphenous vein was also interrogated. Spectral Doppler was  utilized to evaluate flow at rest and  with distal augmentation maneuvers in the common femoral, femoral and popliteal veins. COMPARISON:  None. FINDINGS: Contralateral Common Femoral Vein: Respiratory phasicity is normal and symmetric with the symptomatic side. No evidence of thrombus. Normal compressibility. Common Femoral Vein: Nonocclusive thrombus is noted within the distal left common femoral vein, just proximal to its origin. Saphenofemoral Junction: No evidence of thrombus. Normal compressibility and flow on color Doppler imaging. Profunda Femoral Vein: No evidence of thrombus. Normal compressibility and flow on color Doppler imaging. Femoral Vein: Occlusive thrombus is noted within the left femoral vein. Popliteal Vein: Occlusive thrombus is noted within the left popliteal vein. Calf Veins: Occlusive thrombus is noted within the visualized posterior tibial vein and one of the peroneal veins. The more posterior peroneal vein appears patent. Superficial Great Saphenous Vein: No evidence of thrombus. Normal compressibility and flow on color Doppler imaging. Venous Reflux:  None. Other Findings:  None. IMPRESSION: Occlusive deep venous thrombosis noted within the left femoral vein, left popliteal vein and calf veins. Nonocclusive thrombus noted within the distal left common femoral vein. These results were called by telephone at the time of interpretation on 04/23/2016 at 6:34 pm to Dr. Lavonia Drafts, who verbally acknowledged these results. Electronically Signed   By: Garald Balding M.D.   On: 04/23/2016 18:35     ASSESSMENT AND PLAN:   Active Problems:   DVT (deep vein thrombosis) in pregnancy ([place])   DVT (deep venous thrombosis) ([facility])   Acute occlusive  DVT in the left femoral, left popliteal, calf veins: Vascular consult for possible thrombectomy tomorrow. continue heparin drip. D/w pt and RN    All the records are reviewed and case discussed with Care Management/Social Workerr. Management  plans discussed with the patient, family and they are in agreement.  CODE STATUS:full  TOTAL TIME TAKING CARE OF THIS PATIENT: 35 minutes.   POSSIBLE D/C IN 1-2DAYS, DEPENDING ON CLINICAL CONDITION.   Epifanio Lesches M.D on 04/24/2016 at 11:10 AM  Between 7am to 6pm - Pager - [phone]  After 6pm go to [email or web address] - password [email or web address] [facility] : [facility]  [facility]  Office  [phone]  CC: Primary care physician; Freddy Finner, NP

## 2016-04-25 LAB — CBC
HEMATOCRIT: 42.3 % (ref 40.0–52.0)
HEMOGLOBIN: 14.2 g/dL (ref 13.0–18.0)
MCH: 29.8 pg (ref 26.0–34.0)
MCHC: 33.5 g/dL (ref 32.0–36.0)
MCV: 88.9 fL (ref 80.0–100.0)
Platelets: 257 10*3/uL (ref 150–440)
RBC: 4.76 MIL/uL (ref 4.40–5.90)
RDW: 13.5 % (ref 11.5–14.5)
WBC: 8.7 10*3/uL (ref 3.8–10.6)

## 2016-04-25 LAB — HEPARIN LEVEL (UNFRACTIONATED): Heparin Unfractionated: 0.62 IU/mL (ref 0.30–0.70)

## 2016-04-25 MED ORDER — SODIUM CHLORIDE 0.9 % IV SOLN
INTRAVENOUS | Status: DC
  Administered 2016-04-26 (×2): via INTRAVENOUS

## 2016-04-25 MED ORDER — CLINDAMYCIN PHOSPHATE 300 MG/50ML IV SOLN
300.0000 mg | Freq: Once | INTRAVENOUS | Status: DC
  Filled 2016-04-25: qty 50

## 2016-04-25 NOTE — Progress Notes (Signed)
[place] at [place] NAME: Eric Cunningham    MR#:  [id number]  DATE OF BIRTH:  1976/01/17  SUBJECTIVE: Admitted for  Extensive left leg DVT. Today he feels better. No leg pain or shortness of breath. He is scheduled for thrombectomy tomorrow. Complaints of small red bump on penis.  CHIEF COMPLAINT:   Chief Complaint  Patient presents with  . Leg Swelling    REVIEW OF SYSTEMS:    Review of Systems  Constitutional: Negative for chills and fever.  HENT: Negative for hearing loss.   Eyes: Negative for blurred vision, double vision and photophobia.  Respiratory: Negative for cough, hemoptysis and shortness of breath.   Cardiovascular: Negative for palpitations, orthopnea and leg swelling.  Gastrointestinal: Negative for abdominal pain, diarrhea and vomiting.  Genitourinary: Negative for dysuria and urgency.  Musculoskeletal: Negative for myalgias and neck pain.  Skin: Negative for rash.  Neurological: Negative for dizziness, focal weakness, seizures, weakness and headaches.  Psychiatric/Behavioral: Negative for memory loss. The patient does not have insomnia.     Nutrition:  Tolerating Diet: Tolerating PT:      DRUG ALLERGIES:   Allergies  Allergen Reactions  . Ativan [Lorazepam] Other (See Comments)    Patient states he was very anxious and very hyper.  . Penicillins Other (See Comments)    Patient states his mother said he was allergic at birth. Unknown reation    VITALS:  Blood pressure 116/76, pulse 70, temperature 98.2 F (36.8 C), temperature source Oral, resp. rate 16, height 5\' 6"  (1.676 m), weight 93.9 kg (207 lb), SpO2 97 %.  PHYSICAL EXAMINATION:   Physical Exam  GENERAL:  40 y.o.-year-old patient lying in the bed with no acute distress.  EYES: Pupils equal, round, reactive to light and accommodation. No scleral icterus. Extraocular muscles intact.  HEENT: Head atraumatic, normocephalic. Oropharynx  and nasopharynx clear.  NECK:  Supple, no jugular venous distention. No thyroid enlargement, no tenderness.  LUNGS: Normal breath sounds bilaterally, no wheezing, rales,rhonchi or crepitation. No use of accessory muscles of respiration.  CARDIOVASCULAR: S1, S2 normal. No murmurs, rubs, or gallops.  ABDOMEN: Soft, nontender, nondistended. Bowel sounds present. No organomegaly or mass.  EXTREMITIES: No pedal edema, cyanosis, or clubbing.  NEUROLOGIC: Cranial nerves II through XII are intact. Muscle strength 5/5 in all extremities. Sensation intact. Gait not checked.  PSYCHIATRIC: The patient is alert and oriented x 3.  SKIN: No obvious rash, lesion, or ulcer.    LABORATORY PANEL:   CBC  Recent Labs Lab 04/25/16 0521  WBC 8.7  HGB 14.2  HCT 42.3  PLT 257   ------------------------------------------------------------------------------------------------------------------  Chemistries   Recent Labs Lab 04/23/16 1922  NA 136  K 3.7  CL 106  CO2 23  GLUCOSE 100*  BUN 16  CREATININE 0.67  CALCIUM 9.1   ------------------------------------------------------------------------------------------------------------------  Cardiac Enzymes No results for input(s): TROPONINI in the last 168 hours. ------------------------------------------------------------------------------------------------------------------  RADIOLOGY:  US Venous Img Lower Unilateral Left  Result Date: 04/23/2016 CLINICAL DATA:  Acute onset of left leg swelling and pain. Initial encounter. EXAM: LEFT LOWER EXTREMITY VENOUS DOPPLER ULTRASOUND TECHNIQUE: Gray-scale sonography with graded compression, as well as color Doppler and duplex ultrasound were performed to evaluate the lower extremity deep venous systems from the level of the common femoral vein and including the common femoral, femoral, profunda femoral, popliteal and calf veins including the posterior tibial, peroneal and gastrocnemius veins when visible. The  superficial great saphenous vein was  also interrogated. Spectral Doppler was utilized to evaluate flow at rest and with distal augmentation maneuvers in the common femoral, femoral and popliteal veins. COMPARISON:  None. FINDINGS: Contralateral Common Femoral Vein: Respiratory phasicity is normal and symmetric with the symptomatic side. No evidence of thrombus. Normal compressibility. Common Femoral Vein: Nonocclusive thrombus is noted within the distal left common femoral vein, just proximal to its origin. Saphenofemoral Junction: No evidence of thrombus. Normal compressibility and flow on color Doppler imaging. Profunda Femoral Vein: No evidence of thrombus. Normal compressibility and flow on color Doppler imaging. Femoral Vein: Occlusive thrombus is noted within the left femoral vein. Popliteal Vein: Occlusive thrombus is noted within the left popliteal vein. Calf Veins: Occlusive thrombus is noted within the visualized posterior tibial vein and one of the peroneal veins. The more posterior peroneal vein appears patent. Superficial Great Saphenous Vein: No evidence of thrombus. Normal compressibility and flow on color Doppler imaging. Venous Reflux:  None. Other Findings:  None. IMPRESSION: Occlusive deep venous thrombosis noted within the left femoral vein, left popliteal vein and calf veins. Nonocclusive thrombus noted within the distal left common femoral vein. These results were called by telephone at the time of interpretation on 04/23/2016 at 6:34 pm to Dr. Lavonia Drafts, who verbally acknowledged these results. Electronically Signed   By: Garald Balding M.D.   On: 04/23/2016 18:35     ASSESSMENT AND PLAN:   Active Problems:   DVT (deep vein thrombosis) in pregnancy ([place])   DVT (deep venous thrombosis) ([facility])   Acute occlusive  DVT in the left femoral, left popliteal, calf veins: Vascular consult for possible thrombectomy tomorrow. continue heparin drip.  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 Grayland Ormond before , will also request hematology to see for possible use of long-term anticoagulation.  D/w pt  And RN 3.small red spot on penis;may be non specific.can follow up as an  Out pt. No penile discharge,  All the records are reviewed and case discussed with Care Management/Social Workerr. Management plans discussed with the patient, family and they are in agreement.  CODE STATUS:full  TOTAL TIME TAKING CARE OF THIS PATIENT: 35 minutes.   POSSIBLE D/C IN 1-2DAYS, DEPENDING ON CLINICAL CONDITION.   Epifanio Lesches M.D on 04/25/2016 at 2:07 PM  Between 7am to 6pm - Pager - [phone]  After 6pm go to [email or web address] - password [email or web address] [email or web address]  [facility]  Office  [phone]  CC: Primary care physician; Freddy Finner, NP

## 2016-04-25 NOTE — Progress Notes (Addendum)
[place] for Heparin Drip Indication: DVT  Allergies  Allergen Reactions  . Ativan [Lorazepam] Other (See Comments)    Patient states he was very anxious and very hyper.  . Penicillins Other (See Comments)    Patient states his mother said he was allergic at birth. Unknown reation    Patient Measurements: Height: 5\' 6"  (167.6 cm) Weight: 207 lb (93.9 kg) IBW/kg (Calculated) : 63.8 Heparin Dosing Weight: 84kg  Vital Signs: Temp: 98 F (36.7 C) (10/02 0623) Temp Source: Oral (10/02 0623) BP: 114/79 (10/02 0623) Pulse Rate: 63 (10/02 0623)  Labs:  Recent Labs  04/23/16 1859 04/23/16 1922 04/24/16 0209 04/24/16 0744 04/25/16 0521  HGB 14.7  --  13.6  --  14.2  HCT 43.5  --  40.3  --  42.3  PLT 271  --  229  --  257  APTT  --  26  --   --   --   LABPROT  --  12.4  --   --   --   INR  --  0.92  --   --   --   HEPARINUNFRC  --   --  0.70 0.65 0.62  CREATININE  --  0.67  --   --   --     Estimated Creatinine Clearance: 130.3 mL/min (by C-G formula based on SCr of 0.67 mg/dL).   Medical History: Past Medical History:  Diagnosis Date  . DVT (deep venous thrombosis) ([place])   . History of DVT (deep vein thrombosis) 2010    Medications:  Scheduled:  . sodium chloride flush  3 mL Intravenous Q12H   Infusions:  . heparin 1,500 Units/hr (04/25/16 0325)    Assessment: 40 y.o. male who presents with complaints of left leg swelling and pain. Patient has a history of a DVT 8 years ago which required thrombectomy hospital stay in ICU. Pharmacy consulted to dose and monitor heparin drip for acute DVT of femoral vein of LLE. Patient currently ordered heparin 1500 units/hr.   Goal of Therapy:  Heparin level 0.3-0.7 units/ml Monitor platelets by anticoagulation protocol: Yes   Plan:  Daily heparin level therapeutic. Continue current rate. Will continue to follow HL and CBC daily.  10/3 AM heparin level 0.63. Continue current  regimen. Recheck heparin level and CBC tomorrow AM.  Laural Benes, Pharm.D., BCPS Clinical Pharmacist 04/25/2016,7:03 AM

## 2016-04-26 ENCOUNTER — Encounter
Admission: EM | Disposition: A | Discharge status: Home / Self Care | Payer: Self-pay | Source: Home / Self Care | Attending: Internal Medicine

## 2016-04-26 DIAGNOSIS — I82402 Acute embolism and thrombosis of unspecified deep veins of left lower extremity: Secondary | ICD-10-CM

## 2016-04-26 DIAGNOSIS — I2699 Other pulmonary embolism without acute cor pulmonale: Secondary | ICD-10-CM

## 2016-04-26 DIAGNOSIS — I82401 Acute embolism and thrombosis of unspecified deep veins of right lower extremity: Secondary | ICD-10-CM

## 2016-04-26 HISTORY — PX: PERIPHERAL VASCULAR CATHETERIZATION: SHX172C

## 2016-04-26 LAB — BASIC METABOLIC PANEL
Anion gap: 5 (ref 5–15)
BUN: 15 mg/dL (ref 6–20)
CALCIUM: 9 mg/dL (ref 8.9–10.3)
CHLORIDE: 104 mmol/L (ref 101–111)
CO2: 29 mmol/L (ref 22–32)
CREATININE: 0.83 mg/dL (ref 0.61–1.24)
Glucose, Bld: 103 mg/dL — ABNORMAL HIGH (ref 65–99)
Potassium: 3.6 mmol/L (ref 3.5–5.1)
SODIUM: 138 mmol/L (ref 135–145)

## 2016-04-26 LAB — HEPARIN LEVEL (UNFRACTIONATED)
HEPARIN UNFRACTIONATED: 0.63 [IU]/mL (ref 0.30–0.70)
Heparin Unfractionated: 0.22 IU/mL — ABNORMAL LOW (ref 0.30–0.70)
Heparin Unfractionated: 0.54 IU/mL (ref 0.30–0.70)

## 2016-04-26 LAB — CBC
HCT: 43.6 % (ref 40.0–52.0)
Hemoglobin: 14.7 g/dL (ref 13.0–18.0)
MCH: 29.9 pg (ref 26.0–34.0)
MCHC: 33.6 g/dL (ref 32.0–36.0)
MCV: 89 fL (ref 80.0–100.0)
PLATELETS: 275 10*3/uL (ref 150–440)
RBC: 4.9 MIL/uL (ref 4.40–5.90)
RDW: 14.1 % (ref 11.5–14.5)
WBC: 9.1 10*3/uL (ref 3.8–10.6)

## 2016-04-26 SURGERY — LOWER EXTREMITY VENOGRAPHY
Anesthesia: Moderate Sedation | Laterality: Left

## 2016-04-26 MED ORDER — ALTEPLASE 2 MG IJ SOLR
INTRAMUSCULAR | Status: DC | PRN
Start: 2016-04-26 — End: 2016-04-26
  Administered 2016-04-26: 14 mg

## 2016-04-26 MED ORDER — LIDOCAINE HCL (PF) 1 % IJ SOLN
INTRAMUSCULAR | Status: AC
  Filled 2016-04-26: qty 30

## 2016-04-26 MED ORDER — APIXABAN 5 MG PO TABS: 5.0000 mg | ORAL_TABLET | Freq: Two times a day (BID) | ORAL | Status: DC

## 2016-04-26 MED ORDER — FENTANYL CITRATE (PF) 100 MCG/2ML IJ SOLN
INTRAMUSCULAR | Status: AC
  Filled 2016-04-26: qty 2

## 2016-04-26 MED ORDER — HEPARIN (PORCINE) IN NACL 2-0.9 UNIT/ML-% IJ SOLN
INTRAMUSCULAR | Status: AC
  Filled 2016-04-26: qty 1500

## 2016-04-26 MED ORDER — DIPHENHYDRAMINE HCL 50 MG/ML IJ SOLN
INTRAMUSCULAR | Status: DC | PRN
  Administered 2016-04-26: 25 mg via INTRAVENOUS

## 2016-04-26 MED ORDER — IOPAMIDOL (ISOVUE-300) INJECTION 61%
INTRAVENOUS | Status: DC | PRN
  Administered 2016-04-26: 55 mL via INTRAVENOUS

## 2016-04-26 MED ORDER — HEPARIN SODIUM (PORCINE) 1000 UNIT/ML IJ SOLN
INTRAMUSCULAR | Status: AC
  Filled 2016-04-26: qty 1

## 2016-04-26 MED ORDER — APIXABAN 5 MG PO TABS: 10.0000 mg | ORAL_TABLET | Freq: Two times a day (BID) | ORAL | 0 refills | Status: DC

## 2016-04-26 MED ORDER — FENTANYL CITRATE (PF) 100 MCG/2ML IJ SOLN
INTRAMUSCULAR | Status: DC | PRN
  Administered 2016-04-26: 50 ug via INTRAVENOUS
  Administered 2016-04-26: 25 ug via INTRAVENOUS
  Administered 2016-04-26: 50 ug via INTRAVENOUS
  Administered 2016-04-26: 25 ug via INTRAVENOUS
  Administered 2016-04-26 (×3): 50 ug via INTRAVENOUS

## 2016-04-26 MED ORDER — ALTEPLASE 2 MG IJ SOLR
INTRAMUSCULAR | Status: AC
  Filled 2016-04-26: qty 14

## 2016-04-26 MED ORDER — APIXABAN 5 MG PO TABS: 5.0000 mg | ORAL_TABLET | Freq: Two times a day (BID) | ORAL | 0 refills | Status: DC

## 2016-04-26 MED ORDER — HEPARIN (PORCINE) IN NACL 100-0.45 UNIT/ML-% IJ SOLN
1500.0000 [IU]/h | INTRAMUSCULAR | Status: DC
  Administered 2016-04-26: 1500 [IU]/h via INTRAVENOUS
  Filled 2016-04-26 (×3): qty 250

## 2016-04-26 MED ORDER — MIDAZOLAM HCL 2 MG/2ML IJ SOLN
INTRAMUSCULAR | Status: DC | PRN
  Administered 2016-04-26 (×4): 1 mg via INTRAVENOUS
  Administered 2016-04-26: 2 mg via INTRAVENOUS
  Administered 2016-04-26: 1 mg via INTRAVENOUS

## 2016-04-26 MED ORDER — APIXABAN 5 MG PO TABS
10.0000 mg | ORAL_TABLET | Freq: Two times a day (BID) | ORAL | Status: DC
  Administered 2016-04-26 – 2016-04-27 (×2): 10 mg via ORAL
  Filled 2016-04-26 (×3): qty 2

## 2016-04-26 MED ORDER — MIDAZOLAM HCL 5 MG/5ML IJ SOLN
INTRAMUSCULAR | Status: AC
  Filled 2016-04-26: qty 5

## 2016-04-26 MED ORDER — DIPHENHYDRAMINE HCL 50 MG/ML IJ SOLN
INTRAMUSCULAR | Status: AC
  Filled 2016-04-26: qty 1

## 2016-04-26 SURGICAL SUPPLY — 19 items
BALLN DORADO 8X100X80 (BALLOONS) ×3
BALLN ULTRVRSE 9X80X75 (BALLOONS) ×3
BALLOON DORADO 8X100X80 (BALLOONS) ×1 IMPLANT
BALLOON ULTRVRSE 9X80X75 (BALLOONS) ×1 IMPLANT
CATH KA2 5FR 65CM (CATHETERS) ×3 IMPLANT
DEVICE PRESTO INFLATION (MISCELLANEOUS) ×3 IMPLANT
DEVICE SAFEGUARD 24CM (GAUZE/BANDAGES/DRESSINGS) ×3 IMPLANT
DEVICE TORQUE (MISCELLANEOUS) ×3 IMPLANT
FILTER VC CELECT-FEMORAL (Filter) ×3 IMPLANT
GUIDEWIRE ZIPWIRE .035X180CM (WIRE) ×3 IMPLANT
NEEDLE ENTRY 21GA 7CM ECHOTIP (NEEDLE) ×3 IMPLANT
PACK ANGIOGRAPHY (CUSTOM PROCEDURE TRAY) ×3 IMPLANT
SET INTRO CAPELLA COAXIAL (SET/KITS/TRAYS/PACK) ×3 IMPLANT
SET ZELANTE DVT THROMB (CATHETERS) ×3 IMPLANT
SHEATH BRITE TIP 6FRX11 (SHEATH) ×3 IMPLANT
SHEATH BRITE TIP 8FRX11 (SHEATH) ×3 IMPLANT
SHIELD RADPAD SCOOP 12X17 (MISCELLANEOUS) ×3 IMPLANT
WIRE J 3MM .035X145CM (WIRE) ×3 IMPLANT
WIRE MAGIC TORQUE 260C (WIRE) ×3 IMPLANT

## 2016-04-26 NOTE — Progress Notes (Addendum)
[place] for Heparin Drip Indication: DVT  Allergies  Allergen Reactions  . Ativan [Lorazepam] Other (See Comments)    Patient states he was very anxious and very hyper.  . Penicillins Other (See Comments)    Patient states his mother said he was allergic at birth. Unknown reation    Patient Measurements: Height: 5\' 6"  (167.6 cm) Weight: 207 lb (93.9 kg) IBW/kg (Calculated) : 63.8 Heparin Dosing Weight: 84kg  Vital Signs: Temp: 97.8 F (36.6 C) (10/03 1315) Temp Source: Oral (10/03 1315) BP: 125/90 (10/03 1315) Pulse Rate: 56 (10/03 1315)  Labs:  Recent Labs  04/23/16 1922 04/24/16 0209  04/25/16 0521 04/26/16 0454 04/26/16 1406 04/26/16 1816  HGB  --  13.6  --  14.2 14.7  --   --   HCT  --  40.3  --  42.3 43.6  --   --   PLT  --  229  --  257 275  --   --   APTT 26  --   --   --   --   --   --   LABPROT 12.4  --   --   --   --   --   --   INR 0.92  --   --   --   --   --   --   HEPARINUNFRC  --  0.70  < > 0.62 0.63 0.22* 0.54  CREATININE 0.67  --   --   --  0.83  --   --   < > = values in this interval not displayed.  Estimated Creatinine Clearance: 125.6 mL/min (by C-G formula based on SCr of 0.83 mg/dL).   Medical History: Past Medical History:  Diagnosis Date  . DVT (deep venous thrombosis) ([place])   . History of DVT (deep vein thrombosis) 2010    Medications:  Scheduled:  . apixaban  10 mg Oral BID  . clindamycin (CLEOCIN) IV  300 mg Intravenous Once  . sodium chloride flush  3 mL Intravenous Q12H   Infusions:  . heparin 1,500 Units/hr (04/26/16 1743)    Assessment: 40 y.o. male who presents with complaints of left leg swelling and pain. Patient has a history of a DVT 8 years ago which required thrombectomy hospital stay in ICU. Pharmacy consulted to dose and monitor heparin drip for acute DVT of femoral vein of LLE. Patient currently ordered heparin 1500 units/hr.   Goal of Therapy:  Heparin level 0.3-0.7  units/ml Monitor platelets by anticoagulation protocol: Yes   Plan:  10/3 AM heparin level = 0.63 (therapeutic).   10/3 PM heparin level= 0.54.  Will continue current rate. Will confirm drip stop at 2100 and start of apixaban.   10/03 PM. Heparin d/c. First dose of Eliquis charted as given.  Sheema M Hallaji, Pharm.D. Clinical Pharmacist 04/26/2016,6:43 PM

## 2016-04-26 NOTE — Progress Notes (Signed)
Eric [place] for Heparin Drip Indication: DVT  Allergies  Allergen Reactions  . Ativan [Lorazepam] Other (See Comments)    Patient states he was very anxious and very hyper.  . Penicillins Other (See Comments)    Patient states his mother said he was allergic at birth. Unknown reation    Patient Measurements: Height: 5\' 6"  (167.6 cm) Weight: 207 lb (93.9 kg) IBW/kg (Calculated) : 63.8 Heparin Dosing Weight: 84kg  Vital Signs: Temp: 97.8 F (36.6 C) (10/03 1315) Temp Source: Oral (10/03 1315) BP: 125/90 (10/03 1315) Pulse Rate: 56 (10/03 1315)  Labs:  Recent Labs  04/23/16 1922 04/24/16 0209  04/25/16 0521 04/26/16 0454 04/26/16 1406  HGB  --  13.6  --  14.2 14.7  --   HCT  --  40.3  --  42.3 43.6  --   PLT  --  229  --  257 275  --   APTT 26  --   --   --   --   --   LABPROT 12.4  --   --   --   --   --   INR 0.92  --   --   --   --   --   HEPARINUNFRC  --  0.70  < > 0.62 0.63 0.22*  CREATININE 0.67  --   --   --  0.83  --   < > = values in this interval not displayed.  Estimated Creatinine Clearance: 125.6 mL/min (by C-G formula based on SCr of 0.83 mg/dL).   Medical History: Past Medical History:  Diagnosis Date  . DVT (deep venous thrombosis) ([place])   . History of DVT (deep vein thrombosis) 2010    Medications:  Scheduled:  . apixaban  10 mg Oral BID  . clindamycin (CLEOCIN) IV  300 mg Intravenous Once  . sodium chloride flush  3 mL Intravenous Q12H   Infusions:  . sodium chloride 75 mL/hr at 04/26/16 0434  . heparin      Assessment: 40 y.o. male who presents with complaints of left leg swelling and pain. Patient has a history of a DVT 8 years ago which required thrombectomy hospital stay in ICU. Pharmacy consulted to dose and monitor heparin drip for acute DVT of femoral vein of LLE.   Patient has been therapeutic on heparin infusion of 1500 units/hr. Patient underwent thrombectomy today. Spoke with MD Schnier  to clarify orders post-vascular intervention.   MD ordered heparin drip to be resumed at rate of 1500 units/hr starting at noon today. MD ordered heparin drip to continue until 2200 this evening. Patient to be transitioned to apixaban @ 2100.  Ordered heparin level @1400  per instructions to ensure heparin level was not supratherapeutic.Two hour HL @ 1406 = 0.22. Spoke with RN who confirmed there were no interruptions in heparin drip. Will continue heparin drip at current rate of 1500 units/hr.   Goal of Therapy:  Heparin level 0.3-0.7 units/ml Monitor platelets by anticoagulation protocol: Yes   Plan:  Continue heparin infusion of 1500 units/hr and recheck 6 hour HL.  Apixaban will start tonight per MD  Lenis Noon, Pharm.D., BCPS Clinical Pharmacist 04/26/2016,3:44 PM

## 2016-04-26 NOTE — Progress Notes (Addendum)
Notified pharmacist of updated heparin order in patient chart post-procedure and that the heparin was restarted at noon as ordered.

## 2016-04-26 NOTE — Progress Notes (Signed)
Pressure dressing on R femoral removed per MD order.

## 2016-04-26 NOTE — Progress Notes (Signed)
[place] for Heparin Drip Indication: DVT  Allergies  Allergen Reactions  . Ativan [Lorazepam] Other (See Comments)    Patient states he was very anxious and very hyper.  . Penicillins Other (See Comments)    Patient states his mother said he was allergic at birth. Unknown reation    Patient Measurements: Height: 5\' 6"  (167.6 cm) Weight: 207 lb (93.9 kg) IBW/kg (Calculated) : 63.8 Heparin Dosing Weight: 84kg  Vital Signs:    Labs:  Recent Labs  04/23/16 1922  04/24/16 0209 04/24/16 0744 04/25/16 0521 04/26/16 0454  HGB  --   --  13.6  --  14.2 14.7  HCT  --   --  40.3  --  42.3 43.6  PLT  --   --  229  --  257 275  APTT 26  --   --   --   --   --   LABPROT 12.4  --   --   --   --   --   INR 0.92  --   --   --   --   --   HEPARINUNFRC  --   < > 0.70 0.65 0.62 0.63  CREATININE 0.67  --   --   --   --  0.83  < > = values in this interval not displayed.  Estimated Creatinine Clearance: 125.6 mL/min (by C-G formula based on SCr of 0.83 mg/dL).   Medical History: Past Medical History:  Diagnosis Date  . DVT (deep venous thrombosis) ([place])   . History of DVT (deep vein thrombosis) 2010    Medications:  Scheduled:  . clindamycin (CLEOCIN) IV  300 mg Intravenous Once  . sodium chloride flush  3 mL Intravenous Q12H   Infusions:  . sodium chloride 75 mL/hr at 04/26/16 0434  . heparin 1,500 Units/hr (04/25/16 2057)    Assessment: 40 y.o. male who presents with complaints of left leg swelling and pain. Patient has a history of a DVT 8 years ago which required thrombectomy hospital stay in ICU. Pharmacy consulted to dose and monitor heparin drip for acute DVT of femoral vein of LLE. Patient currently ordered heparin 1500 units/hr.   Goal of Therapy:  Heparin level 0.3-0.7 units/ml Monitor platelets by anticoagulation protocol: Yes   Plan:  10/3 AM heparin level = 0.63 (therapeutic). Continue heparin at current rate of 1500  units/hr.  Will recheck heparin level and CBC tomorrow AM.  Lenis Noon, Pharm.D., BCPS Clinical Pharmacist 04/26/2016,8:37 AM

## 2016-04-26 NOTE — Progress Notes (Signed)
[place] at [place] NAME: Eric Cunningham    MR#:  [id number]  DATE OF BIRTH:  1975-07-31  SUBJECTIVE: Admitted for  Extensive left leg DVT. Today he feels better. No leg pain or shortness of breath. S/p thrombectomy today.continue heparin drip till 9 pm today.pt is very sleepy due to sedation  He received for procedure,  CHIEF COMPLAINT:   Chief Complaint  Patient presents with  . Leg Swelling    REVIEW OF SYSTEMS:    Review of Systems  Constitutional: Negative for chills and fever.  HENT: Negative for hearing loss.   Eyes: Negative for blurred vision, double vision and photophobia.  Respiratory: Negative for cough, hemoptysis and shortness of breath.   Cardiovascular: Negative for palpitations, orthopnea and leg swelling.  Gastrointestinal: Negative for abdominal pain, diarrhea and vomiting.  Genitourinary: Negative for dysuria and urgency.  Musculoskeletal: Negative for myalgias and neck pain.  Skin: Negative for rash.  Neurological: Negative for dizziness, focal weakness, seizures, weakness and headaches.  Psychiatric/Behavioral: Negative for memory loss. The patient does not have insomnia.     Nutrition:  Tolerating Diet: Tolerating PT:      DRUG ALLERGIES:   Allergies  Allergen Reactions  . Ativan [Lorazepam] Other (See Comments)    Patient states he was very anxious and very hyper.  . Penicillins Other (See Comments)    Patient states his mother said he was allergic at birth. Unknown reation    VITALS:  Blood pressure 125/90, pulse (!) 56, temperature 97.8 F (36.6 C), temperature source Oral, resp. rate 18, height 5\' 6"  (1.676 m), weight 93.9 kg (207 lb), SpO2 98 %.  PHYSICAL EXAMINATION:   Physical Exam  GENERAL:  40 y.o.-year-old patient lying in the bed with no acute distress.  EYES: Pupils equal, round, reactive to light and accommodation. No scleral icterus. Extraocular muscles intact.   HEENT: Head atraumatic, normocephalic. Oropharynx and nasopharynx clear.  NECK:  Supple, no jugular venous distention. No thyroid enlargement, no tenderness.  LUNGS: Normal breath sounds bilaterally, no wheezing, rales,rhonchi or crepitation. No use of accessory muscles of respiration.  CARDIOVASCULAR: S1, S2 normal. No murmurs, rubs, or gallops.  ABDOMEN: Soft, nontender, nondistended. Bowel sounds present. No organomegaly or mass.  EXTREMITIES: No pedal edema, cyanosis, or clubbing.  NEUROLOGIC: Cranial nerves II through XII are intact. Muscle strength 5/5 in all extremities. Sensation intact. Gait not checked.  PSYCHIATRIC: The patient is alert and oriented x 3.  SKIN: No obvious rash, lesion, or ulcer.    LABORATORY PANEL:   CBC  Recent Labs Lab 04/26/16 0454  WBC 9.1  HGB 14.7  HCT 43.6  PLT 275   ------------------------------------------------------------------------------------------------------------------  Chemistries   Recent Labs Lab 04/26/16 0454  NA 138  K 3.6  CL 104  CO2 29  GLUCOSE 103*  BUN 15  CREATININE 0.83  CALCIUM 9.0   ------------------------------------------------------------------------------------------------------------------  Cardiac Enzymes No results for input(s): TROPONINI in the last 168 hours. ------------------------------------------------------------------------------------------------------------------  RADIOLOGY:  No results found.   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 Grayland Ormond before ,   D/w pt  And RN 3.small red spot on penis;may be non specific.can follow up as an  Out pt. No penile discharge,  All the records are reviewed and case discussed with Care Management/Social Workerr. Management plans discussed with the patient, family and they are in agreement.  CODE STATUS:full  TOTAL TIME TAKING CARE OF THIS PATIENT: 35 minutes.   POSSIBLE D/C IN 1-2DAYS, DEPENDING ON CLINICAL CONDITION.   Epifanio Lesches M.D on 04/26/2016 at 6:12 PM  Between 7am to 6pm - Pager - [phone]  After 6pm go to [email or web address] - password [email or web address] [facility]  [facility]  Office  [phone]  CC: Primary care physician; Freddy Finner, NP

## 2016-04-26 NOTE — H&P (Signed)
[facility] History & Physical Update  The patient was interviewed and re-examined.  The patient's previous History and Physical has been reviewed and is unchanged.  There is no change in the plan of care. We plan to proceed with the scheduled procedure.  Hortencia Pilar, MD  04/26/2016, 9:08 AM

## 2016-04-26 NOTE — Consult Note (Signed)
Case discussed with Dr. Vianne Bulls. Recommending discharge patient on anticoagulation with Eliquis.  Patient will follow-up in the [facility] 2-3 weeks after discharge for further evaluation and discussion of the length of anticoagulation.

## 2016-04-26 NOTE — Op Note (Signed)
[facility]   OPERATIVE NOTE    PRE-OPERATIVE DIAGNOSIS: Symptomatic left leg DVT; right leg DVT; bilateral pulmonary embolism; ovarian cancer  POST-OPERATIVE DIAGNOSIS: Same  PROCEDURE: 1. Ultrasound guidance for vascular access to the right common femoral vein and left popliteal vein 2. Catheter placement into the inferior vena cava for placement of IVC filter 3. Inferior venacavogram 4. Placement of a Ceslect IVC filter infrarenal 5. Introduction catheter into venous system second order catheter placement left popliteal approach 6. Infusion thrombolysis with 14 mg of TPA left lower extremity venous system 7. Mechanical thrombectomy of the popliteal, SFV common femoral vein using the Zelante catheter left lower extremity venous system  SURGEON: Hortencia Pilar  ASSISTANT(S): None  ANESTHESIA: local/sedation  ESTIMATED BLOOD LOSS: minimal  Contrast: 60 cc  Fluoroscopy time: 6.8 minutes  FINDING(S): 1. Patent IVC; thrombus within the left popliteal, superficial femoral vein and common femoral vein  SPECIMEN(S): none  INDICATIONS:  Eric Cunningham is a 40 y.o. year old male who presents with massive swelling of the left leg quite painful in association with pleuritic chest pains and hypoxia as well as shortness of breath. Inferior vena cava filter is indicated for this reason. Risks and benefits including filter thrombosis, migration, fracture, bleeding, and infection were all discussed. We discussed that all IVC filters that we place can be removed if desired from the patient once the need for the filter has passed.   DESCRIPTION: After obtaining full informed written consent, the patient was brought back to the vascular suite. The skin was sterilely prepped and draped in a sterile surgical field was created. The right common femoral vein was accessed under direct ultrasound guidance without difficulty with a Seldinger  needle and a J-wire was then placed. The dilator is passed over the wire and the delivery sheath was placed into the inferior vena cava. Inferior venacavogram was performed. This demonstrated a patent IVC with the level of the renal veins at L1-L2. The filter was then deployed into the inferior vena cava at the level of inferior margin of L2 just below the renal veins. The delivery sheath was then removed. Pressure was held. Sterile dressings were placed. The patient tolerated the procedure well.  She was then repositioned to the prone and her popliteal fossa of the left leg was prepped and draped in a sterile fashion. Ultrasound was placed in a sterile sleeve. Ultrasound is utilized to gain access to the popliteal vein. Vein is known to have thrombus within it by previous ultrasound. It is therefore identified by its enlarged size with echolucent thrombus and non-compressibility. 1% lidocaine is infiltrated in soft tissues and subsequent microneedle is inserted into the popliteal vein without difficulty. Images recorded for the permanent record and the puncture is made under real-time visualization. Microwire is then advanced under fluoroscopic guidance and noted to follow the path of the superficial femoral vein. Therefore the micro-sheath was placed followed by a Magic torque wire and subsequently an 8 [place] sheath.  KMP catheter together with the Magic torque wire then advanced up to the level of the common iliac and hand injection contrast is utilized to demonstrate that the common iliac vein on the left is patent the inferior vena cava is known to be patent from the above filter placement. Catheter is then repositioned to the common femoral level and hand injection contrast demonstrates that there is a and occlusive thrombus within the common femoral however the external iliac vein on the left appears to be  patent. Repositioning of the KMP catheter then demonstrated thrombus within the superficial  femoral vein and popliteal vein.  Zelonte catheter is then prepped on the field and 14 mg of TPA is reconstituted 100 cc. This is then laced throughout the common femoral superficial femoral and popliteal veins. The TPA is then allowed to dwell. Subsequently, the AngioJet catheter is engaged in the aspiration mode and aspiration of the common femoral as well as the popliteal and superficial femoral veins is performed multiple passes are made with the volumes recorded in the procedure notes.  Follow-up imaging now demonstrates that almost all the thrombus is been eliminated from the proximal popliteal as well as the superficial femoral vein. There does not appear to be any abnormality of the vein such as a stricture or stenosis. Within the common femoral vein there is a high-grade residual narrowing and therefore initially a 8 x 10 balloon was inflated to 14 atm for 1 minute and subsequently a 9 x 10 balloon is inflated to 14 atm for 1 minute. Follow-up imaging demonstrates there is some residual thrombus but a marked improvement and therefore the AngioJet is advanced to this level and multiple short passes are made through this region. After this maneuver follow-up imaging demonstrates near total resolution within the common femoral vein. Iliac veins are unchanged and are magnified image of the IVC and filter demonstrates that the IVC and the filter remained widely patent with no evidence of thrombus within the filter. Given that the patient now is widely patent with minimal residual thrombus from the distal popliteal to the IVC the procedure is terminated. The wires removed the sheath is removed pressures held and a pressure dressing with Coban and is then applied. The patient is then returned to the supine position  Interpretation: Initial images demonstrated normal vena cava filter is placed without difficulty. Initial images the left lower extremity then demonstrated extensive thrombus throughout the  popliteal and femoral veins however the iliac veins remained widely patent. Following intervention described above there is near-total resolution of thrombus throughout.  COMPLICATIONS: None  CONDITION: Stable  Hortencia Pilar  04/26/2016,1:57 PM

## 2016-04-27 ENCOUNTER — Encounter: Payer: Self-pay | Admitting: Vascular Surgery

## 2016-04-27 LAB — CBC
HCT: 43 % (ref 40.0–52.0)
Hemoglobin: 14.5 g/dL (ref 13.0–18.0)
MCH: 30.2 pg (ref 26.0–34.0)
MCHC: 33.7 g/dL (ref 32.0–36.0)
MCV: 89.6 fL (ref 80.0–100.0)
PLATELETS: 231 10*3/uL (ref 150–440)
RBC: 4.8 MIL/uL (ref 4.40–5.90)
RDW: 13.8 % (ref 11.5–14.5)
WBC: 9 10*3/uL (ref 3.8–10.6)

## 2016-04-27 NOTE — Care Management (Signed)
Patient to discharge today on Eliquis.  Patient was provided coupon for 30 day free supply coupon.  No further RNCM needs identified.

## 2016-04-27 NOTE — Progress Notes (Signed)
04/27/2016 11:01 AM  BP 122/74 (BP Location: Left Arm)   Pulse 75   Temp 97.9 F (36.6 C) (Oral)   Resp 20   Ht 5\' 6"  (1.676 m)   Wt 93.9 kg (207 lb)   SpO2 98%   BMI 33.41 kg/m  Patient discharged per MD orders. Discharge instructions reviewed with patient and patient verbalized understanding. IV removed per policy. Prescriptions discussed with patient. Discharged via wheelchair escorted by Loanne Drilling, Jory Sims, RN

## 2016-04-27 NOTE — Discharge Summary (Signed)
Eric Cunningham, is a 40 y.o. male  DOB 1976-05-12  MRN [id number].  Admission date:  04/23/2016  Admitting Physician  Bettey Costa, MD  Discharge Date:  04/27/2016   Primary MD  Freddy Finner, NP  Recommendations for primary care physician for things to follow:   Follow up  With Dr. Grayland Ormond in 2 weeks.   Admission Diagnosis  Acute deep vein thrombosis (DVT) of femoral vein of left lower extremity (HCC) [I82.412]   Discharge Diagnosis  Acute deep vein thrombosis (DVT) of femoral vein of left lower extremity (HCC) [I82.412]    Active Problems:   DVT (deep vein thrombosis) in pregnancy ([place])   DVT (deep venous thrombosis) ([place])      Past Medical History:  Diagnosis Date  . DVT (deep venous thrombosis) ([place])   . History of DVT (deep vein thrombosis) 2010    Past Surgical History:  Procedure Laterality Date  . PERIPHERAL VASCULAR CATHETERIZATION Left 04/26/2016   Procedure: Lower Extremity Venography and thrombectomy;  Surgeon: Katha Cabal, MD;  Location: [facility];  Service: Cardiovascular;  Laterality: Left;  . removal of dvt Left        History of present illness and  Hospital Course:     Kindly see H&P for history of present illness and admission details, please review complete Labs, Consult reports and Test reports for all details in brief  HPI  from the history and physical done on the day of admission 40 year old male patient admitted on the 30th because of left leg pain. Patient admitted for extensive DVT of the left leg.  Hospital Course  #1. Occlusive  DVT of left femoral, left popliteal veins: Started on heparin drip, vascular surgery consult obtained. Patient had a thrombectomy, IVC filter placed by vascular surgery yesterday that is October 3. Patient had history of 10 hr car  drive to [place] that precipitated this clot. Because this is the second time patient having DVT, I discussed the case with Dr. Grayland Ormond, he recommended full dose anticoagulation, he is going to see the patient in office in 2-3 weeks.same cussed with patient. Discharge him home with Eliquis 10 mg po BID for one week followed by 5mg  po BID thereafter for at least 3 weeks/   Discharge Condition: stable   Follow UP  Follow-up Information    Lloyd Huger, MD Follow up on 05/17/2016.   Specialty:  Oncology Why:  Tuesday @3 :30pm for hospital follow-up Contact information: [address] [place] [address] [phone]        Freddy Finner, NP. Go on 05/19/2016.   Specialty:  Nurse Practitioner Why:  @[id number] Contact information: [address] [place] [address] [phone]             Discharge Instructions  and  Discharge Medications     Discharge Instructions    Discharge instructions    Complete by:  As directed    eliquis 10 mg po bid for 10  Days followed by eliquis 5 mg po bid Follow with Dr.Finnegan in 2 weeks       Medication List    TAKE these medications   apixaban 5 MG Tabs tablet Commonly known as:  ELIQUIS Take 2 tablets (10 mg total) by mouth 2 (two) times daily.   apixaban 5 MG Tabs tablet Commonly known as:  ELIQUIS Take 1 tablet (5 mg total) by mouth 2 (two) times daily. Start taking on:  05/03/2016  Diet and Activity recommendation: See Discharge Instructions above   Consults obtained - surgery   Major procedures and Radiology Reports - PLEASE review detailed and final reports for all details, in brief -      US Venous Img Lower Unilateral Left  Result Date: 04/23/2016 CLINICAL DATA:  Acute onset of left leg swelling and pain. Initial encounter. EXAM: LEFT LOWER EXTREMITY VENOUS DOPPLER ULTRASOUND TECHNIQUE: Gray-scale sonography with graded compression, as well as color Doppler and  duplex ultrasound were performed to evaluate the lower extremity deep venous systems from the level of the common femoral vein and including the common femoral, femoral, profunda femoral, popliteal and calf veins including the posterior tibial, peroneal and gastrocnemius veins when visible. The superficial great saphenous vein was also interrogated. Spectral Doppler was utilized to evaluate flow at rest and with distal augmentation maneuvers in the common femoral, femoral and popliteal veins. COMPARISON:  None. FINDINGS: Contralateral Common Femoral Vein: Respiratory phasicity is normal and symmetric with the symptomatic side. No evidence of thrombus. Normal compressibility. Common Femoral Vein: Nonocclusive thrombus is noted within the distal left common femoral vein, just proximal to its origin. Saphenofemoral Junction: No evidence of thrombus. Normal compressibility and flow on color Doppler imaging. Profunda Femoral Vein: No evidence of thrombus. Normal compressibility and flow on color Doppler imaging. Femoral Vein: Occlusive thrombus is noted within the left femoral vein. Popliteal Vein: Occlusive thrombus is noted within the left popliteal vein. Calf Veins: Occlusive thrombus is noted within the visualized posterior tibial vein and one of the peroneal veins. The more posterior peroneal vein appears patent. Superficial Great Saphenous Vein: No evidence of thrombus. Normal compressibility and flow on color Doppler imaging. Venous Reflux:  None. Other Findings:  None. IMPRESSION: Occlusive deep venous thrombosis noted within the left femoral vein, left popliteal vein and calf veins. Nonocclusive thrombus noted within the distal left common femoral vein. These results were called by telephone at the time of interpretation on 04/23/2016 at 6:34 pm to Dr. Lavonia Drafts, who verbally acknowledged these results. Electronically Signed   By: Garald Balding M.D.   On: 04/23/2016 18:35    Micro Results     No  results found for this or any previous visit (from the past 240 hour(s)).     Today   Subjective:   Eric Cunningham today has no headache,no chest abdominal pain,no new weakness tingling or numbness, feels much better wants to go home today.   Objective:   Blood pressure 122/74, pulse 75, temperature 97.9 F (36.6 C), temperature source Oral, resp. rate 20, height 5\' 6"  (1.676 m), weight 93.9 kg (207 lb), SpO2 98 %.   Intake/Output Summary (Last 24 hours) at 04/27/16 1302 Last data filed at 04/27/16 1020  Gross per 24 hour  Intake             1802 ml  Output                0 ml  Net             1802 ml    Exam Awake Alert, Oriented x 3, No new F.N deficits, Normal affect [place].AT,PERRAL Supple Neck,No JVD, No cervical lymphadenopathy appriciated.  Symmetrical Chest wall movement, Good air movement bilaterally, CTAB RRR,No Gallops,Rubs or new Murmurs, No Parasternal Heave +ve B.Sounds, Abd Soft, Non tender, No organomegaly appriciated, No rebound -guarding or rigidity. No Cyanosis, Clubbing or edema, No new Rash or bruise  Data Review   CBC w Diff: Lab  Results  Component Value Date   WBC 9.0 04/27/2016   HGB 14.5 04/27/2016   HCT 43.0 04/27/2016   PLT 231 04/27/2016    CMP: Lab Results  Component Value Date   NA 138 04/26/2016   K 3.6 04/26/2016   CL 104 04/26/2016   CO2 29 04/26/2016   BUN 15 04/26/2016   CREATININE 0.83 04/26/2016  .   Total Time in preparing paper work, data evaluation and todays exam - 93 minutes  Cono Gebhard M.D on 04/27/2016 at 1:02 PM    Note: This dictation was prepared with Dragon dictation along with smaller phrase technology. Any transcriptional errors that result from this process are unintentional.

## 2016-05-16 NOTE — Progress Notes (Signed)
[facility]  [phone] [phone]  ID: Eric Cunningham OB: 02/24/1976  MR#: [id number]  [id number]  Patient Care Team: Freddy Finner, NP as PCP - General (Nurse Practitioner)  CHIEF COMPLAINT: Acute DVT of left femoral vein  INTERVAL HISTORY: Patient returns to clinic for further evaluation in hospital follow-up. He recently was diagnosed with a left femoral vein DVT which he reports occurred after a long road trip to [place]. He has a history of left DVT in 2010 in [place], also after a long road trip. He currently is taking Eliquis and is tolerating it well without significant side effects. Currently, he feels well and is asymptomatic. He has no neurologic complaints. He denies any recent fevers or illnesses. He has a good appetite and denies weight loss. He has no chest pain or shortness of breath. He denies any nausea, vomiting, constipation, or diarrhea. He has no urinary complaints. He denies any easy bleeding or bruising. Patient feels at his baseline and offers no specific complaints today.  REVIEW OF SYSTEMS:   Review of Systems  Constitutional: Negative for fever, malaise/fatigue and weight loss.  Respiratory: Negative.   Cardiovascular: Negative.   Gastrointestinal: Negative.   Musculoskeletal: Negative.   Neurological: Negative.  Negative for weakness.  Endo/Heme/Allergies: Does not bruise/bleed easily.  Psychiatric/Behavioral: Negative.    As per HPI. Otherwise, a complete review of systems is negative.   PAST MEDICAL HISTORY: Past Medical History:  Diagnosis Date  . DVT (deep venous thrombosis) ([place])   . History of DVT (deep vein thrombosis) 2010    PAST SURGICAL HISTORY: Past Surgical History:  Procedure Laterality Date  . PERIPHERAL VASCULAR CATHETERIZATION Left 04/26/2016   Procedure: Lower Extremity Venography and thrombectomy;  Surgeon: Katha Cabal, MD;  Location: [facility];  Service:  Cardiovascular;  Laterality: Left;  . removal of dvt Left     FAMILY HISTORY: Patient reports mother has multiple DVTs.     ADVANCED DIRECTIVES:    HEALTH MAINTENANCE: Social History  Substance Use Topics  . Smoking status: Light Tobacco Smoker    Types: Cigars  . Smokeless tobacco: Never Used  . Alcohol use Yes     Comment: occasional     Colonoscopy:  PAP:  Bone density:  Lipid panel:  Allergies  Allergen Reactions  . Ativan [Lorazepam] Other (See Comments)    Patient states he was very anxious and very hyper.  . Penicillins Other (See Comments)    Patient states his mother said he was allergic at birth. Unknown reation    Current Outpatient Prescriptions  Medication Sig Dispense Refill  . apixaban (ELIQUIS) 5 MG TABS tablet Take 1 tablet (5 mg total) by mouth 2 (two) times daily. 60 tablet 2   No current facility-administered medications for this visit.     OBJECTIVE: Vitals:   05/17/16 1529  BP: 130/87  Pulse: 80  Resp: 18  Temp: 98.8 F (37.1 C)     There is no height or weight on file to calculate BMI.    ECOG FS:0 - Asymptomatic  General: Well-developed, well-nourished, no acute distress. Eyes: Pink conjunctiva, anicteric sclera. HEENT: Normocephalic, moist mucous membranes, clear oropharnyx. Lungs: Clear to auscultation bilaterally. Heart: Regular rate and rhythm. No rubs, murmurs, or gallops. Abdomen: Soft, nontender, nondistended. No organomegaly noted, normoactive bowel sounds. Musculoskeletal: No edema, cyanosis, or clubbing. Neuro: Alert, answering all questions appropriately. Cranial nerves grossly intact. Skin: No rashes or petechiae noted. Psych: Normal affect. Lymphatics: No cervical,  calvicular, axillary or inguinal LAD.   LAB RESULTS:  Lab Results  Component Value Date   NA 138 04/26/2016   K 3.6 04/26/2016   CL 104 04/26/2016   CO2 29 04/26/2016   GLUCOSE 103 (H) 04/26/2016   BUN 15 04/26/2016   CREATININE 0.83 04/26/2016    CALCIUM 9.0 04/26/2016   GFRNONAA >60 04/26/2016   GFRAA >60 04/26/2016    Lab Results  Component Value Date   WBC 9.0 04/27/2016   HGB 14.5 04/27/2016   HCT 43.0 04/27/2016   MCV 89.6 04/27/2016   PLT 231 04/27/2016     STUDIES: US Venous Img Lower Unilateral Left  Result Date: 04/23/2016 CLINICAL DATA:  Acute onset of left leg swelling and pain. Initial encounter. EXAM: LEFT LOWER EXTREMITY VENOUS DOPPLER ULTRASOUND TECHNIQUE: Gray-scale sonography with graded compression, as well as color Doppler and duplex ultrasound were performed to evaluate the lower extremity deep venous systems from the level of the common femoral vein and including the common femoral, femoral, profunda femoral, popliteal and calf veins including the posterior tibial, peroneal and gastrocnemius veins when visible. The superficial great saphenous vein was also interrogated. Spectral Doppler was utilized to evaluate flow at rest and with distal augmentation maneuvers in the common femoral, femoral and popliteal veins. COMPARISON:  None. FINDINGS: Contralateral Common Femoral Vein: Respiratory phasicity is normal and symmetric with the symptomatic side. No evidence of thrombus. Normal compressibility. Common Femoral Vein: Nonocclusive thrombus is noted within the distal left common femoral vein, just proximal to its origin. Saphenofemoral Junction: No evidence of thrombus. Normal compressibility and flow on color Doppler imaging. Profunda Femoral Vein: No evidence of thrombus. Normal compressibility and flow on color Doppler imaging. Femoral Vein: Occlusive thrombus is noted within the left femoral vein. Popliteal Vein: Occlusive thrombus is noted within the left popliteal vein. Calf Veins: Occlusive thrombus is noted within the visualized posterior tibial vein and one of the peroneal veins. The more posterior peroneal vein appears patent. Superficial Great Saphenous Vein: No evidence of thrombus. Normal compressibility and  flow on color Doppler imaging. Venous Reflux:  None. Other Findings:  None. IMPRESSION: Occlusive deep venous thrombosis noted within the left femoral vein, left popliteal vein and calf veins. Nonocclusive thrombus noted within the distal left common femoral vein. These results were called by telephone at the time of interpretation on 04/23/2016 at 6:34 pm to Dr. Lavonia Drafts, who verbally acknowledged these results. Electronically Signed   By: Garald Balding M.D.   On: 04/23/2016 18:35    ASSESSMENT:  Acute DVT of left femoral vein  PLAN:    1. Acute DVT of left femoral vein: Full hypercoagulable workup is negative, although patient did have a mildly decreased protein S level which can be repeated once he is off anticoagulation. 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  Given patient has had two DVTs in his lifetime even with transient risk factors, it is reasonable to remain on lifelong anticoagulation. Given patient's young age, he is hesitant to do this. We also discussed the possibility of using Lovenox for extended trips or other transient risk factors in order to minimize his risk of additional DVTs. Finally, it was also discussed to discontinue his current anticoagulation after 3 months and monitor. Patient has not made a decision today, but has been instructed to call clinic with his final decision. Patient was given a prescription for Eliquis to complete 3 months of treatment.  Approximately 30 minutes was spent  in discussion of which greater than 50% was consultation.  Patient expressed understanding and was in agreement with this plan. He also understands that He can call clinic at any time with any questions, concerns, or complaints.    Eric Huger, MD   05/22/2016 9:47 AM

## 2016-05-17 ENCOUNTER — Inpatient Hospital Stay: Payer: Medicaid Other | Attending: Oncology | Admitting: Oncology

## 2016-05-17 VITALS — BP 130/87 | HR 80 | Temp 98.8°F | Resp 18

## 2016-05-17 DIAGNOSIS — Z7901 Long term (current) use of anticoagulants: Secondary | ICD-10-CM | POA: Insufficient documentation

## 2016-05-17 DIAGNOSIS — F17210 Nicotine dependence, cigarettes, uncomplicated: Secondary | ICD-10-CM | POA: Insufficient documentation

## 2016-05-17 DIAGNOSIS — I82412 Acute embolism and thrombosis of left femoral vein: Secondary | ICD-10-CM

## 2016-05-17 DIAGNOSIS — Z79899 Other long term (current) drug therapy: Secondary | ICD-10-CM | POA: Insufficient documentation

## 2016-05-17 DIAGNOSIS — Z86718 Personal history of other venous thrombosis and embolism: Secondary | ICD-10-CM | POA: Insufficient documentation

## 2016-05-17 MED ORDER — APIXABAN 5 MG PO TABS: 5.0000 mg | ORAL_TABLET | Freq: Two times a day (BID) | ORAL | 2 refills | Status: DC

## 2016-05-17 NOTE — Progress Notes (Signed)
States is feeling well. Recently was diagnosed with another DVT in left leg. States had trauma to ankle which may have contributed to blood clot developing. Notices slight swelling in left leg with increased activity.

## 2016-07-19 ENCOUNTER — Other Ambulatory Visit: Payer: Self-pay | Admitting: Oncology

## 2016-07-19 DIAGNOSIS — I82412 Acute embolism and thrombosis of left femoral vein: Secondary | ICD-10-CM

## 2016-12-30 ENCOUNTER — Encounter (INDEPENDENT_AMBULATORY_CARE_PROVIDER_SITE_OTHER): Payer: Self-pay

## 2016-12-30 ENCOUNTER — Encounter: Payer: Self-pay | Admitting: Family Medicine

## 2016-12-30 ENCOUNTER — Ambulatory Visit (INDEPENDENT_AMBULATORY_CARE_PROVIDER_SITE_OTHER): Payer: 59 | Admitting: Family Medicine

## 2016-12-30 VITALS — BP 112/84 | HR 68 | Temp 98.2°F | Ht 65.75 in | Wt 200.2 lb

## 2016-12-30 DIAGNOSIS — Z86718 Personal history of other venous thrombosis and embolism: Secondary | ICD-10-CM

## 2016-12-30 DIAGNOSIS — Z0000 Encounter for general adult medical examination without abnormal findings: Secondary | ICD-10-CM | POA: Insufficient documentation

## 2016-12-30 LAB — LIPID PANEL
CHOLESTEROL: 219 mg/dL — AB (ref 0–200)
HDL: 68.9 mg/dL (ref >39.00)
LDL Cholesterol: 136 mg/dL — ABNORMAL HIGH (ref 0–99)
NONHDL: 149.82
Total CHOL/HDL Ratio: 3
Triglycerides: 71 mg/dL (ref 0.0–149.0)
VLDL: 14.2 mg/dL (ref 0.0–40.0)

## 2016-12-30 LAB — CBC
HEMATOCRIT: 45.8 % (ref 39.0–52.0)
Hemoglobin: 15.4 g/dL (ref 13.0–17.0)
MCHC: 33.7 g/dL (ref 30.0–36.0)
MCV: 90 fl (ref 78.0–100.0)
Platelets: 323 10*3/uL (ref 150.0–400.0)
RBC: 5.08 Mil/uL (ref 4.22–5.81)
RDW: 13.7 % (ref 11.5–15.5)
WBC: 5.4 10*3/uL (ref 4.0–10.5)

## 2016-12-30 LAB — COMPREHENSIVE METABOLIC PANEL
ALK PHOS: 69 U/L (ref 39–117)
ALT: 20 U/L (ref 0–53)
AST: 20 U/L (ref 0–37)
Albumin: 4.7 g/dL (ref 3.5–5.2)
BUN: 17 mg/dL (ref 6–23)
CHLORIDE: 105 meq/L (ref 96–112)
CO2: 27 mEq/L (ref 19–32)
Calcium: 9.7 mg/dL (ref 8.4–10.5)
Creatinine, Ser: 0.84 mg/dL (ref 0.40–1.50)
GFR: 106.28 mL/min (ref >60.00)
GLUCOSE: 106 mg/dL — AB (ref 70–99)
POTASSIUM: 4.4 meq/L (ref 3.5–5.1)
SODIUM: 139 meq/L (ref 135–145)
TOTAL PROTEIN: 7.7 g/dL (ref 6.0–8.3)
Total Bilirubin: 0.6 mg/dL (ref 0.2–1.2)

## 2016-12-30 LAB — HEMOGLOBIN A1C: HEMOGLOBIN A1C: 5.3 % (ref 4.6–6.5)

## 2016-12-30 NOTE — Assessment & Plan Note (Signed)
Doing well. Advised dietary changes, weight loss, and smoking cessation. Screening labs today for Holding off on Tdap today.

## 2016-12-30 NOTE — Progress Notes (Signed)
Subjective:  Patient ID: Eric Cunningham, male    DOB: 09-25-1975  Age: 41 y.o. MRN: [id number]  CC: Establish care/physical  HPI Eric Cunningham is a 41 y.o. male presents to the clinic today to establish care. He is in need of a physical.  Preventative Healthcare  Immunizations  Tetanus - In need of.  Labs: Screening labs today.  Alcohol use: See below.  Smoking/tobacco use: Smokes marijuana.  PMH, Surgical Hx, Family Hx, Social History reviewed and updated as below.  Past Medical History:  Diagnosis Date  . Asthma   . Chicken pox   . DVT (deep venous thrombosis) ([place])   . History of blood transfusion   . History of DVT (deep vein thrombosis)    x 2   Past Surgical History:  Procedure Laterality Date  . PERIPHERAL VASCULAR CATHETERIZATION Left 04/26/2016   Procedure: Lower Extremity Venography and thrombectomy;  Surgeon: Katha Cabal, MD;  Location: [facility];  Service: Cardiovascular;  Laterality: Left;  . removal of dvt Left    Family History  Problem Relation Age of Onset  . Clotting disorder Mother   . Cancer Paternal Uncle    Social History  Substance Use Topics  . Smoking status: Light Tobacco Smoker    Types: Cigars  . Smokeless tobacco: Never Used  . Alcohol use 3.6 oz/week    6 Cans of beer per week     Comment: occasional, once a week    Review of Systems General: Denies unexplained weight loss, fever. Skin: Denies new or changing mole, sore/wound that won't heal. ENT: Trouble hearing, ringing in the ears, sores in the mouth, hoarseness, trouble swallowing. Eyes: Denies trouble seeing/visual disturbance. Heart/CV: Denies chest pain, shortness of breath, edema, palpitations. Lungs/Resp: Denies cough, shortness of breath, hemoptysis. Abd/GI: Denies nausea, vomiting, diarrhea, constipation, abdominal pain, hematochezia, melena. GU: Denies dysuria, incontinence, hematuria, urinary frequency, difficulty starting/keeping  stream, penile discharge, sexual difficulty, lump in testicles. MSK: Denies joint pain/swelling, myalgias. Neuro: Denies headaches, weakness, numbness, dizziness, syncope. Psych: Denies sadness, anxiety, stress, memory difficulty. Endocrine: Denies polyuria and polydipsia.  Objective:   Today's Vitals: BP 112/84 (BP Location: Left Arm, Patient Position: Sitting, Cuff Size: Normal)   Pulse 68   Temp 98.2 F (36.8 C) (Oral)   Ht 5' 5.75" (1.67 m)   Wt 200 lb 4 oz (90.8 kg)   BMI 32.57 kg/m   Physical Exam  Constitutional: He is oriented to person, place, and time. He appears well-developed and well-nourished. No distress.  HENT:  Head: Normocephalic and atraumatic.  Nose: Nose normal.  Mouth/Throat: Oropharynx is clear and moist. No oropharyngeal exudate.  Normal TM's bilaterally.   Eyes: Conjunctivae are normal. No scleral icterus.  Neck: Neck supple.  Cardiovascular: Normal rate and regular rhythm.   No murmur heard. Pulmonary/Chest: Effort normal and breath sounds normal. He has no wheezes. He has no rales.  Abdominal: Soft. He exhibits no distension. There is no tenderness. There is no rebound and no guarding.  Musculoskeletal: Normal range of motion. He exhibits no edema.  Lymphadenopathy:    He has no cervical adenopathy.  Neurological: He is alert and oriented to person, place, and time.  Skin: Skin is warm and dry. No rash noted.  Psychiatric: He has a normal mood and affect.  Vitals reviewed.  Assessment & Plan:   Problem List Items Addressed This Visit      Other   History of DVT (deep vein thrombosis)   Annual  physical exam - Primary    Doing well. Advised dietary changes, weight loss, and smoking cessation. Screening labs today for Holding off on Tdap today.      Relevant Orders   CBC   Hemoglobin A1c   Comprehensive metabolic panel   Lipid panel     Follow-up: No Follow-up on file.  [place]

## 2016-12-30 NOTE — Patient Instructions (Signed)
Follow up annually.  Take care  Dr. Eilene Voigt    Health Maintenance, Male A healthy lifestyle and preventive care is important for your health and wellness. Ask your health care provider about what schedule of regular examinations is right for you. What should I know about weight and diet? Eat a Healthy Diet  Eat plenty of vegetables, fruits, whole grains, low-fat dairy products, and lean protein.  Do not eat a lot of foods high in solid fats, added sugars, or salt.  Maintain a Healthy Weight Regular exercise can help you achieve or maintain a healthy weight. You should:  Do at least 150 minutes of exercise each week. The exercise should increase your heart rate and make you sweat (moderate-intensity exercise).  Do strength-training exercises at least twice a week.  Watch Your Levels of Cholesterol and Blood Lipids  Have your blood tested for lipids and cholesterol every 5 years starting at 43 years of age. If you are at high risk for heart disease, you should start having your blood tested when you are 43 years old. You may need to have your cholesterol levels checked more often if: ? Your lipid or cholesterol levels are high. ? You are older than 43 years of age. ? You are at high risk for heart disease.  What should I know about cancer screening? Many types of cancers can be detected early and may often be prevented. Lung Cancer  You should be screened every year for lung cancer if: ? You are a current smoker who has smoked for at least 30 years. ? You are a former smoker who has quit within the past 15 years.  Talk to your health care provider about your screening options, when you should start screening, and how often you should be screened.  Colorectal Cancer  Routine colorectal cancer screening usually begins at 43 years of age and should be repeated every 5-10 years until you are 43 years old. You may need to be screened more often if early forms of precancerous polyps or  small growths are found. Your health care provider may recommend screening at an earlier age if you have risk factors for colon cancer.  Your health care provider may recommend using home test kits to check for hidden blood in the stool.  A small camera at the end of a tube can be used to examine your colon (sigmoidoscopy or colonoscopy). This checks for the earliest forms of colorectal cancer.  Prostate and Testicular Cancer  Depending on your age and overall health, your health care provider may do certain tests to screen for prostate and testicular cancer.  Talk to your health care provider about any symptoms or concerns you have about testicular or prostate cancer.  Skin Cancer  Check your skin from head to toe regularly.  Tell your health care provider about any new moles or changes in moles, especially if: ? There is a change in a mole's size, shape, or color. ? You have a mole that is larger than a pencil eraser.  Always use sunscreen. Apply sunscreen liberally and repeat throughout the day.  Protect yourself by wearing long sleeves, pants, a wide-brimmed hat, and sunglasses when outside.  What should I know about heart disease, diabetes, and high blood pressure?  If you are 18-39 years of age, have your blood pressure checked every 3-5 years. If you are 40 years of age or older, have your blood pressure checked every year. You should have your blood pressure measured   twice-once when you are at a hospital or clinic, and once when you are not at a hospital or clinic. Record the average of the two measurements. To check your blood pressure when you are not at a hospital or clinic, you can use: ? An automated blood pressure machine at a pharmacy. ? A home blood pressure monitor.  Talk to your health care provider about your target blood pressure.  If you are between 40-34 years old, ask your health care provider if you should take aspirin to prevent heart disease.  Have regular  diabetes screenings by checking your fasting blood sugar level. ? If you are at a normal weight and have a low risk for diabetes, have this test once every three years after the age of 63. ? If you are overweight and have a high risk for diabetes, consider being tested at a younger age or more often.  A one-time screening for abdominal aortic aneurysm (AAA) by ultrasound is recommended for men aged 38-75 years who are current or former smokers. What should I know about preventing infection? Hepatitis B If you have a higher risk for hepatitis B, you should be screened for this virus. Talk with your health care provider to find out if you are at risk for hepatitis B infection. Hepatitis C Blood testing is recommended for:  Everyone born from [birth year] through 1967.  Anyone with known risk factors for hepatitis C.  Sexually Transmitted Diseases (STDs)  You should be screened each year for STDs including gonorrhea and chlamydia if: ? You are sexually active and are younger than 43 years of age. ? You are older than 43 years of age and your health care provider tells you that you are at risk for this type of infection. ? Your sexual activity has changed since you were last screened and you are at an increased risk for chlamydia or gonorrhea. Ask your health care provider if you are at risk.  Talk with your health care provider about whether you are at high risk of being infected with HIV. Your health care provider may recommend a prescription medicine to help prevent HIV infection.  What else can I do?  Schedule regular health, dental, and eye exams.  Stay current with your vaccines (immunizations).  Do not use any tobacco products, such as cigarettes, chewing tobacco, and e-cigarettes. If you need help quitting, ask your health care provider.  Limit alcohol intake to no more than 2 drinks per day. One drink equals 12 ounces of beer, 5 ounces of wine, or 1 ounces of hard liquor.  Do not use  street drugs.  Do not share needles.  Ask your health care provider for help if you need support or information about quitting drugs.  Tell your health care provider if you often feel depressed.  Tell your health care provider if you have ever been abused or do not feel safe at home. This information is not intended to replace advice given to you by your health care provider. Make sure you discuss any questions you have with your health care provider. Document Released: 01/07/2008 Document Revised: 03/09/2016 Document Reviewed: 04/14/2015 Elsevier Interactive Patient Education  [employer].

## 2017-01-02 ENCOUNTER — Telehealth: Payer: Self-pay | Admitting: *Deleted

## 2017-01-02 NOTE — Telephone Encounter (Signed)
Patient advised of lab results see labs for documentation DOS 12/30/16

## 2017-01-02 NOTE — Telephone Encounter (Signed)
Patient requested lab result  Pt contact [phone]

## 2017-03-13 DIAGNOSIS — K1370 Unspecified lesions of oral mucosa: Secondary | ICD-10-CM | POA: Diagnosis not present

## 2017-06-26 ENCOUNTER — Telehealth: Payer: Self-pay | Admitting: Family Medicine

## 2017-06-26 ENCOUNTER — Telehealth: Payer: Self-pay

## 2017-06-26 NOTE — Telephone Encounter (Signed)
Copied from [place] [phone] [phone]. Topic: Quick Communication - See Telephone Encounter >> Jun 26, 2017 11:19 AM Synthia Innocent wrote: CRM for notification. See Telephone encounter for: patient going on a long car trip, has a history of DVTs, requesting a script of lovenox. [facility]  06/26/17.

## 2017-06-26 NOTE — Telephone Encounter (Signed)
Copied from [place] [id number]. Topic: Quick Communication - Office Called Patient >> Jun 26, 2017  1:26 PM Corie Chiquito, [place] wrote: Reason for CRM: Patient had a missed call from the office and would like a call back please at [phone]

## 2017-06-26 NOTE — Telephone Encounter (Signed)
Attempted to contact pt at [phone]; left voice mail to contact MD's office regarding request for anticoagulant; per chart pt has a history of DVT;  this is a former pt of [facility] and has no upcoming appointments at [facility]; the  work number [phone] has someone other than the pt on voice mail hence no message left there.

## 2017-06-28 ENCOUNTER — Telehealth: Payer: Self-pay | Admitting: Family Medicine

## 2017-06-28 NOTE — Telephone Encounter (Signed)
Can we schedule with Dr. Terese Door before 07/07/2017? I know Dr. Caryl Bis is booked.

## 2017-06-28 NOTE — Telephone Encounter (Signed)
Please schedule patient.

## 2017-06-28 NOTE — Telephone Encounter (Signed)
Patient was established with Dr. Lacinda Axon. He now needs to establish care with another provider before Dec.14 as he is traveling to [place] and has a recent (last year) history of DVTs and is asking for prophylactic medication before his travels.  This is a request to be approved to get established with Dr. Caryl Bis or any other provider in the practice.

## 2017-06-30 NOTE — Telephone Encounter (Signed)
Pt is scheduled °

## 2017-07-10 NOTE — Telephone Encounter (Signed)
Opened in error

## 2017-07-31 ENCOUNTER — Ambulatory Visit: Payer: 59 | Admitting: Internal Medicine

## 2017-08-24 ENCOUNTER — Encounter: Payer: Self-pay | Admitting: Internal Medicine

## 2017-08-24 ENCOUNTER — Ambulatory Visit (INDEPENDENT_AMBULATORY_CARE_PROVIDER_SITE_OTHER): Payer: 59 | Admitting: Internal Medicine

## 2017-08-24 VITALS — BP 110/78 | HR 83 | Temp 98.3°F | Ht 65.75 in | Wt 193.6 lb

## 2017-08-24 DIAGNOSIS — Z1329 Encounter for screening for other suspected endocrine disorder: Secondary | ICD-10-CM | POA: Diagnosis not present

## 2017-08-24 DIAGNOSIS — Z86718 Personal history of other venous thrombosis and embolism: Secondary | ICD-10-CM | POA: Diagnosis not present

## 2017-08-24 DIAGNOSIS — Z23 Encounter for immunization: Secondary | ICD-10-CM

## 2017-08-24 DIAGNOSIS — E785 Hyperlipidemia, unspecified: Secondary | ICD-10-CM

## 2017-08-24 LAB — TSH: TSH: 1.19 u[IU]/mL (ref 0.35–4.50)

## 2017-08-24 LAB — LIPID PANEL
CHOLESTEROL: 221 mg/dL — AB (ref 0–200)
HDL: 69.7 mg/dL (ref >39.00)
LDL CALC: 136 mg/dL — AB (ref 0–99)
NONHDL: 151.1
Total CHOL/HDL Ratio: 3
Triglycerides: 77 mg/dL (ref 0.0–149.0)
VLDL: 15.4 mg/dL (ref 0.0–40.0)

## 2017-08-24 LAB — T4, FREE: Free T4: 0.87 ng/dL (ref 0.60–1.60)

## 2017-08-24 NOTE — Progress Notes (Signed)
Pre visit review using our clinic review tool, if applicable. No additional management support is needed unless otherwise documented below in the visit note. 

## 2017-08-24 NOTE — Progress Notes (Signed)
Chief Complaint  Patient presents with  . Follow-up    transfer from Dr. Lacinda Axon   Follow up  1. HLD he does report he likes pork and some bad foods disc healthy diet choices and exercise  2. H/o DVT x 2 left leg w/u hypercoagulable negative with Dr. Grayland Ormond and as of 9 or 04/2016 pt opted to not take chronic anticoagulation he has peripheral filter placed 04/2016 with Dr. Delana Meyer and does not have sx's today. He reports chronic left leg swelling wears compression stockings intermittently. No leg pain today or significant swelling other than baseline.  He reports DVT x 2 in left leg both have happened with long road trips and he just took a long road trip called for prophylactic lovenox Rx but declined Rx b/c he needed to establish with the new MD. During his recent road trip he just stopped continuously to exercise and stretch legs. He was on Eliquis and the option was reviewed with he and H/O to be on it lifelong since he has had 2 DVTs in left leg currently he chooses to not do lifelong tx unless he has 3rd DVT in left leg.  3. He does vap THC not interested in currently quitting    Review of Systems  Constitutional: Negative for weight loss.  HENT: Negative for hearing loss.   Eyes: Negative for blurred vision.  Respiratory: Negative for shortness of breath.   Cardiovascular: Positive for leg swelling.       +chronic left leg swelling  Gastrointestinal: Negative for abdominal pain.  Musculoskeletal:       No left leg swelling or pain   Skin: Negative for rash.  Neurological: Negative for headaches.  Psychiatric/Behavioral: Negative for memory loss.   Past Medical History:  Diagnosis Date  . Asthma   . Chicken pox   . DVT (deep venous thrombosis) ([place])   . History of blood transfusion   . 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 Hyperlipidemia    Past Surgical History:  Procedure Laterality Date  . PERIPHERAL VASCULAR  CATHETERIZATION Left 04/26/2016   Procedure: Lower Extremity Venography and thrombectomy;  Surgeon: Katha Cabal, MD;  Location: [facility];  Service: Cardiovascular;  Laterality: Left;  . removal of dvt Left    Family History  Problem Relation Age of Onset  . Clotting disorder Mother   . Cancer Paternal Uncle    Social History   Socioeconomic History  . Marital status: Single    Spouse name: Not on file  . Number of children: Not on file  . Years of education: Not on file  . Highest education level: Not on file  Social Needs  . Financial resource strain: Not on file  . Food insecurity - worry: Not on file  . Food insecurity - inability: Not on file  . Transportation needs - medical: Not on file  . Transportation needs - non-medical: Not on file  Occupational History  . Not on file  Tobacco Use  . Smoking status: Light Tobacco Smoker    Types: Cigars  . Smokeless tobacco: Never Used  Substance and Sexual Activity  . Alcohol use: Yes    Alcohol/week: 3.6 oz    Types: 6 Cans of beer per week    Comment: occasional, once a week  . Drug use: Yes    Types: Marijuana    Comment: vaps THC  . Sexual  activity: Yes  Other Topics Concern  . Not on file  Social History Narrative   Daughter    Married    No outpatient medications have been marked as taking for the 08/24/17 encounter (Office Visit) with McLean-Scocuzza, Nino Glow, MD.   Allergies  Allergen Reactions  . Ativan [Lorazepam] Other (See Comments)    Patient states he was very anxious and very hyper.  . Penicillins Other (See Comments)    Patient states his mother said he was allergic at birth. Unknown reation   No results found for this or any previous visit (from the past 2160 hour(s)). Objective  Body mass index is 31.49 kg/m. Wt Readings from Last 3 Encounters:  08/24/17 193 lb 9.6 oz (87.8 kg)  12/30/16 200 lb 4 oz (90.8 kg)  04/23/16 207 lb (93.9 kg)   Temp Readings from Last 3 Encounters:   08/24/17 98.3 F (36.8 C) (Oral)  12/30/16 98.2 F (36.8 C) (Oral)  05/17/16 98.8 F (37.1 C) (Tympanic)   BP Readings from Last 3 Encounters:  08/24/17 110/78  12/30/16 112/84  05/17/16 130/87   Pulse Readings from Last 3 Encounters:  08/24/17 83  12/30/16 68  05/17/16 80   O2 sat room air 96%  Physical Exam  Constitutional: He is oriented to person, place, and time and well-developed, well-nourished, and in no distress. Vital signs are normal.  HENT:  Head: Normocephalic and atraumatic.  Mouth/Throat: Oropharynx is clear and moist and mucous membranes are normal.  Eyes: Conjunctivae are normal. Pupils are equal, round, and reactive to light.  Cardiovascular: Normal rate, regular rhythm and normal heart sounds.  Neg homans sign left leg  Left leg slightly more  Swollen than right but no calf pain   Pulmonary/Chest: Effort normal and breath sounds normal.  Neurological: He is alert and oriented to person, place, and time. Gait normal. Gait normal.  Skin: Skin is warm, dry and intact.  Psychiatric: Mood, memory, affect and judgment normal.  Nursing note and vitals reviewed.   Assessment   1. H/o DVT x 2 left lower leg  2. HLD  3.HM Plan  1.  Prev. Hypercoag. W/u neg with H/o though slightly low protein S consider repeat when off anticoagulation per H/O note true deficiency would be <20 according to H/o notes.  Pt declines to do long term Eliquis (completed if x 3 months 04/2016) unless he has 3rd DVT though H/O put in note it is reasonable to do lifelong anticoagulation.   Will take precautions with long road trips and stretch  He reports H/o disc with him prophylactic lovenox prn with extended road trips but needed to get from PCP. PCP will need to contact H/) when this happens to inquire about dose for this purpose    He called previously to get this but needed to establish with new MD 1st  2.  Disc healthy diet choices and exercise he admits to poor diet  choices Cholesterol handout  3. Declines flu shot  Given Tdap today  Pt unsure if had hep B vaccine in the past check titer in future   rec healthy diet choices and get wt down  Check lipid, TSH,T4 today consider UA in future had CMET, CBC w/in the last year due comprehensive labs 12/30/17  Declines STD check  rec stop vap [facility]  Provider: Dr. Olivia Mackie McLean-Scocuzza-Internal Medicine

## 2017-08-24 NOTE — Patient Instructions (Addendum)
Please follow up in 6 months sooner if needed  Take care  Try to eat healthier   DTaP Vaccine (Diphtheria, Tetanus, and Pertussis): What You Need to Know 1. Why get vaccinated? Diphtheria, tetanus, and pertussis are serious diseases caused by bacteria. Diphtheria and pertussis are spread from person to person. Tetanus enters the body through cuts or wounds. DIPHTHERIA causes a thick covering in the back of the throat.  It can lead to breathing problems, paralysis, heart failure, and even death.  TETANUS (Lockjaw) causes painful tightening of the muscles, usually all over the body.  It can lead to "locking" of the jaw so the victim cannot open his mouth or swallow. Tetanus leads to death in up to 2 out of 10 cases.  PERTUSSIS (Whooping Cough) causes coughing spells so bad that it is hard for infants to eat, drink, or breathe. These spells can last for weeks.  It can lead to pneumonia, seizures (jerking and staring spells), brain damage, and death.  Diphtheria, tetanus, and pertussis vaccine (DTaP) can help prevent these diseases. Most children who are vaccinated with DTaP will be protected throughout childhood. Many more children would get these diseases if we stopped vaccinating. DTaP is a safer version of an older vaccine called DTP. DTP is no longer used in the [place]. 2. Who should get DTaP vaccine and when? Children should get 5 doses of DTaP vaccine, one dose at each of the following ages:  2 months  4 months  6 months  15-18 months  4-6 years  DTaP may be given at the same time as other vaccines. 3. Some children should not get DTaP vaccine or should wait  Children with minor illnesses, such as a cold, may be vaccinated. But children who are moderately or severely ill should usually wait until they recover before getting DTaP vaccine.  Any child who had a life-threatening allergic reaction after a dose of DTaP should not get another dose.  Any child who suffered  a brain or nervous system disease within 7 days after a dose of DTaP should not get another dose.  Talk with your doctor if your child: ? had a seizure or collapsed after a dose of DTaP, ? cried non-stop for 3 hours or more after a dose of DTaP, ? had a fever over 105F after a dose of DTaP. Ask your doctor for more information. Some of these children should not get another dose of pertussis vaccine, but may get a vaccine without pertussis, called DT. 4. Older children and adults DTaP is not licensed for adolescents, adults, or children 76 years of age and older. But older people still need protection. A vaccine called Tdap is similar to DTaP. A single dose of Tdap is recommended for people 11 through 44 years of age. Another vaccine, called Td, protects against tetanus and diphtheria, but not pertussis. It is recommended every 10 years. There are separate Vaccine Information Statements for these vaccines. 5. What are the risks from DTaP vaccine? Getting diphtheria, tetanus, or pertussis disease is much riskier than getting DTaP vaccine. However, a vaccine, like any medicine, is capable of causing serious problems, such as severe allergic reactions. The risk of DTaP vaccine causing serious harm, or death, is extremely small. Mild problems (common)  Fever (up to about 1 child in 4)  Redness or swelling where the shot was given (up to about 1 child in 4)  Soreness or tenderness where the shot was given (up to about 1 child  in 4) These problems occur more often after the 4th and 5th doses of the DTaP series than after earlier doses. Sometimes the 4th or 5th dose of DTaP vaccine is followed by swelling of the entire arm or leg in which the shot was given, lasting 1-7 days (up to about 1 child in 57). Other mild problems include:  Fussiness (up to about 1 child in 3)  Tiredness or poor appetite (up to about 1 child in 10)  Vomiting (up to about 1 child in 49) These problems generally occur 1-3  days after the shot. Moderate problems (uncommon)  Seizure (jerking or staring) (about 1 child out of 14,000)  Non-stop crying, for 3 hours or more (up to about 1 child out of 1,000)  High fever, over 105F (about 1 child out of 16,000) Severe problems (very rare)  Serious allergic reaction (less than 1 out of a million doses)  Several other severe problems have been reported after DTaP vaccine. These include: ? Long-term seizures, coma, or lowered consciousness ? Permanent brain damage. These are so rare it is hard to tell if they are caused by the vaccine. Controlling fever is especially important for children who have had seizures, for any reason. It is also important if another family member has had seizures. You can reduce fever and pain by giving your child an aspirin-free pain reliever when the shot is given, and for the next 24 hours, following the package instructions. 6. What if there is a serious reaction? What should I look for? Look for anything that concerns you, such as signs of a severe allergic reaction, very high fever, or behavior changes. Signs of a severe allergic reaction can include hives, swelling of the face and throat, difficulty breathing, a fast heartbeat, dizziness, and weakness. These would start a few minutes to a few hours after the vaccination. What should I do?  If you think it is a severe allergic reaction or other emergency that can't wait, call 9-1-1 or get the person to the nearest hospital. Otherwise, call your doctor.  Afterward, the reaction should be reported to the Vaccine Adverse Event Reporting System (VAERS). Your doctor might file this report, or you can do it yourself through the VAERS web site at www.vaers.SamedayNews.es, or by calling 425-035-7729. ? VAERS is only for reporting reactions. They do not give medical advice. 7. The National Vaccine Injury Compensation Program The [employer] Vaccine Injury Compensation Program (VICP) is a federal  program that was created to compensate people who may have been injured by certain vaccines. Persons who believe they may have been injured by a vaccine can learn about the program and about filing a claim by calling (726)144-6581 or visiting the [employer] website at GoldCloset.com.ee. 8. How can I learn more?  Ask your doctor.  Call your local or state health department.  Contact the Centers for Disease Control and Prevention (CDC): ? Call 715-834-6842 (1-800-CDC-INFO) or ? Visit CDC's website at http://hunter.com/ CDC DTaP Vaccine (Diphtheria, Tetanus, and Pertussis) VIS (12/08/05) This information is not intended to replace advice given to you by your health care provider. Make sure you discuss any questions you have with your health care provider. Document Released: 05/08/2006 Document Revised: 03/31/2016 Document Reviewed: 03/31/2016 Elsevier Interactive Patient Education  2017 [employer].

## 2017-08-26 DIAGNOSIS — E785 Hyperlipidemia, unspecified: Secondary | ICD-10-CM | POA: Insufficient documentation

## 2017-09-27 ENCOUNTER — Ambulatory Visit (HOSPITAL_COMMUNITY)
Admission: RE | Admit: 2017-09-27 | Discharge: 2017-09-27 | Disposition: A | Discharge status: Home / Self Care | Payer: 59 | Source: Ambulatory Visit | Attending: Family Medicine | Admitting: Family Medicine

## 2017-09-27 DIAGNOSIS — Z1371 Encounter for nonprocreative screening for genetic disease carrier status: Secondary | ICD-10-CM | POA: Diagnosis not present

## 2017-09-27 LAB — CHROMOSOMES ANALYSIS FOR CF

## 2017-10-11 ENCOUNTER — Other Ambulatory Visit (HOSPITAL_COMMUNITY): Payer: Self-pay

## 2017-10-26 DIAGNOSIS — H524 Presbyopia: Secondary | ICD-10-CM | POA: Diagnosis not present

## 2017-11-10 DIAGNOSIS — K118 Other diseases of salivary glands: Secondary | ICD-10-CM | POA: Diagnosis not present

## 2018-02-21 ENCOUNTER — Encounter: Payer: Self-pay | Admitting: Internal Medicine

## 2018-02-21 ENCOUNTER — Other Ambulatory Visit: Payer: Self-pay

## 2018-02-21 ENCOUNTER — Ambulatory Visit: Payer: 59 | Admitting: Internal Medicine

## 2018-02-21 VITALS — BP 112/76 | HR 74 | Temp 98.5°F | Ht 65.75 in | Wt 187.0 lb

## 2018-02-21 DIAGNOSIS — Z1329 Encounter for screening for other suspected endocrine disorder: Secondary | ICD-10-CM

## 2018-02-21 DIAGNOSIS — Z1389 Encounter for screening for other disorder: Secondary | ICD-10-CM | POA: Diagnosis not present

## 2018-02-21 DIAGNOSIS — E785 Hyperlipidemia, unspecified: Secondary | ICD-10-CM | POA: Diagnosis not present

## 2018-02-21 DIAGNOSIS — Z86718 Personal history of other venous thrombosis and embolism: Secondary | ICD-10-CM

## 2018-02-21 DIAGNOSIS — Z0000 Encounter for general adult medical examination without abnormal findings: Secondary | ICD-10-CM

## 2018-02-21 DIAGNOSIS — L309 Dermatitis, unspecified: Secondary | ICD-10-CM

## 2018-02-21 DIAGNOSIS — Z1159 Encounter for screening for other viral diseases: Secondary | ICD-10-CM

## 2018-02-21 DIAGNOSIS — L918 Other hypertrophic disorders of the skin: Secondary | ICD-10-CM | POA: Diagnosis not present

## 2018-02-21 DIAGNOSIS — Z0184 Encounter for antibody response examination: Secondary | ICD-10-CM

## 2018-02-21 DIAGNOSIS — E559 Vitamin D deficiency, unspecified: Secondary | ICD-10-CM | POA: Diagnosis not present

## 2018-02-21 DIAGNOSIS — B353 Tinea pedis: Secondary | ICD-10-CM

## 2018-02-21 NOTE — Patient Instructions (Addendum)
Use Zeasorb AF Powder to feet daily to 2x per day to keep dry this is antifungal and keeps you dry Clotrimazole cream or lamisil  Or tenactin cream 2x per day  Hydrocortisone 1% otc for poison ivy/oak    sch labs before your next visit  Next visit sch 12/2018 Take care  Call me back if this does not help your feet    Athlete's Foot Athlete's foot (tinea pedis) is a fungal infection of the skin on the feet. It often occurs on the skin that is between or underneath the toes. It can also occur on the soles of the feet. The infection can spread from person to person (is contagious). What are the causes? Athlete's foot is caused by a fungus. This fungus grows in warm, moist places. Most people get athlete's foot by sharing shower stalls, towels, and wet floors with someone who is infected. Not washing your feet or changing your socks often enough can contribute to athlete's foot. What increases the risk? This condition is more likely to develop in:  Men.  People who have a weak body defense system (immune system).  People who have diabetes.  People who use public showers, such as at a gym.  People who wear heavy-duty shoes, such as Environmental manager.  Seasons with warm, humid weather.  What are the signs or symptoms? Symptoms of this condition include:  Itchy areas between the toes or on the soles of the feet.  White, flaky, or scaly areas between the toes or on the soles of the feet.  Very itchy small blisters between the toes or on the soles of the feet.  Small cuts on the skin. These cuts can become infected.  Thick or discolored toenails.  How is this diagnosed? This condition is diagnosed with a medical history and physical exam. Your health care provider may also take a skin or toenail sample to be examined. How is this treated? Treatment for this condition includes antifungal medicines. These may be applied as powders, ointments, or creams. In severe cases,  an oral antifungal medicine may be given. Follow these instructions at home:  Apply or take over-the-counter and prescription medicines only as told by your health care provider.  Keep all follow-up visits as told by your health care provider. This is important.  Do not scratch your feet.  Keep your feet dry: ? Wear cotton or wool socks. Change your socks every day or if they become wet. ? Wear shoes that allow air to circulate, such as sandals or canvas tennis shoes.  Wash and dry your feet: ? Every day or as told by your health care provider. ? After exercising. ? Including the area between your toes.  Do not share towels, nail clippers, or other personal items that touch your feet with others.  If you have diabetes, keep your blood sugar under control. How is this prevented?  Do not share towels.  Wear sandals in wet areas, such as locker rooms and shared showers.  Keep your feet dry: ? Wear cotton or wool socks. Change your socks every day or if they become wet. ? Wear shoes that allow air to circulate, such as sandals or canvas tennis shoes.  Wash and dry your feet after exercising. Pay attention to the area between your toes. Contact a health care provider if:  You have a fever.  You have swelling, soreness, warmth, or redness in your foot.  You are not getting better with treatment.  Your symptoms get worse.  You have new symptoms. This information is not intended to replace advice given to you by your health care provider. Make sure you discuss any questions you have with your health care provider. Document Released: 07/08/2000 Document Revised: 12/17/2015 Document Reviewed: 01/12/2015 Elsevier Interactive Patient Education  2018 [employer].   Cholesterol Cholesterol is a white, waxy, fat-like substance that is needed by the human body in small amounts. The liver makes all the cholesterol we need. Cholesterol is carried from the liver by the blood through  the blood vessels. Deposits of cholesterol (plaques) may build up on blood vessel (artery) walls. Plaques make the arteries narrower and stiffer. Cholesterol plaques increase the risk for heart attack and stroke. You cannot feel your cholesterol level even if it is very high. The only way to know that it is high is to have a blood test. Once you know your cholesterol levels, you should keep a record of the test results. Work with your health care provider to keep your levels in the desired range. What do the results mean?  Total cholesterol is a rough measure of all the cholesterol in your blood.  LDL (low-density lipoprotein) is the "bad" cholesterol. This is the type that causes plaque to build up on the artery walls. You want this level to be low.  HDL (high-density lipoprotein) is the "good" cholesterol because it cleans the arteries and carries the LDL away. You want this level to be high.  Triglycerides are fat that the body can either burn for energy or store. High levels are closely linked to heart disease. What are the desired levels of cholesterol?  Total cholesterol below 200.  LDL below 100 for people who are at risk, below 70 for people at very high risk.  HDL above 40 is good. A level of 60 or higher is considered to be protective against heart disease.  Triglycerides below 150. How can I lower my cholesterol? Diet Follow your diet program as told by your health care provider.  Choose fish or white meat chicken and [place], roasted or baked. Limit fatty cuts of red meat, fried foods, and processed meats, such as sausage and lunch meats.  Eat lots of fresh fruits and vegetables.  Choose whole grains, beans, pasta, potatoes, and cereals.  Choose olive oil, corn oil, or canola oil, and use only small amounts.  Avoid butter, mayonnaise, shortening, or palm kernel oils.  Avoid foods with trans fats.  Drink skim or nonfat milk and eat low-fat or nonfat yogurt and cheeses.  Avoid whole milk, cream, ice cream, egg yolks, and full-fat cheeses.  Healthier desserts include angel food cake, ginger snaps, animal crackers, hard candy, popsicles, and low-fat or nonfat frozen yogurt. Avoid pastries, cakes, pies, and cookies.  Exercise  Follow your exercise program as told by your health care provider. A regular program: ? Helps to decrease LDL and raise HDL. ? Helps with weight control.  Do things that increase your activity level, such as gardening, walking, and taking the stairs.  Ask your health care provider about ways that you can be more active in your daily life.  Medicine  Take over-the-counter and prescription medicines only as told by your health care provider. ? Medicine may be prescribed by your health care provider to help lower cholesterol and decrease the risk for heart disease. This is usually done if diet and exercise have failed to bring down cholesterol levels. ? If you have several risk factors, you  may need medicine even if your levels are normal.  This information is not intended to replace advice given to you by your health care provider. Make sure you discuss any questions you have with your health care provider. Document Released: 04/05/2001 Document Revised: 02/06/2016 Document Reviewed: 01/09/2016 Elsevier Interactive Patient Education  [employer].

## 2018-02-21 NOTE — Progress Notes (Signed)
Chief Complaint  Patient presents with  . Follow-up   F/u  1. C/o leg rash from poison ivy/oak and prone to this nothing tried not really itchy  2. C/o skin tags to trunk 3. C/o fungus in toe webs and irritated. He does sweat a lot on his feet  4. HLD disc healthy diet and he has lost wt   Review of Systems  Constitutional: Positive for weight loss.  HENT: Negative for hearing loss.   Eyes: Negative for blurred vision.  Respiratory: Negative for shortness of breath.   Cardiovascular: Negative for chest pain.  Skin: Positive for rash.  Psychiatric/Behavioral: Negative for depression.   Past Medical History:  Diagnosis Date  . Asthma   . Chicken pox   . DVT (deep venous thrombosis) ([place])   . History of blood transfusion   . 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 Hyperlipidemia    Past Surgical History:  Procedure Laterality Date  . PERIPHERAL VASCULAR CATHETERIZATION Left 04/26/2016   Procedure: Lower Extremity Venography and thrombectomy;  Surgeon: Katha Cabal, MD;  Location: [facility];  Service: Cardiovascular;  Laterality: Left;  . removal of dvt Left    Family History  Problem Relation Age of Onset  . Clotting disorder Mother   . Cancer Paternal Uncle    Social History   Socioeconomic History  . Marital status: Single    Spouse name: Not on file  . Number of children: Not on file  . Years of education: Not on file  . Highest education level: Not on file  Occupational History  . Not on file  Social Needs  . Financial resource strain: Not on file  . Food insecurity:    Worry: Not on file    Inability: Not on file  . Transportation needs:    Medical: Not on file    Non-medical: Not on file  Tobacco Use  . Smoking status: Light Tobacco Smoker    Types: Cigars  . Smokeless tobacco: Never Used  Substance and Sexual Activity  . Alcohol use: Yes    Alcohol/week: 3.6 oz    Types: 6 Cans of  beer per week    Comment: occasional, once a week  . Drug use: Yes    Types: Marijuana    Comment: vaps THC  . Sexual activity: Yes  Lifestyle  . Physical activity:    Days per week: Not on file    Minutes per session: Not on file  . Stress: Not on file  Relationships  . Social connections:    Talks on phone: Not on file    Gets together: Not on file    Attends religious service: Not on file    Active member of club or organization: Not on file    Attends meetings of clubs or organizations: Not on file    Relationship status: Not on file  . Intimate partner violence:    Fear of current or ex partner: Not on file    Emotionally abused: Not on file    Physically abused: Not on file    Forced sexual activity: Not on file  Other Topics Concern  . Not on file  Social History Narrative   Daughter    Married    No outpatient medications have been marked as taking for the 02/21/18 encounter (Office Visit) with McLean-Scocuzza, Nino Glow, MD.   Allergies  Allergen  Reactions  . Ativan [Lorazepam] Other (See Comments)    Patient states he was very anxious and very hyper.  . Penicillins Other (See Comments)    Patient states his mother said he was allergic at birth. Unknown reation   No results found for this or any previous visit (from the past 2160 hour(s)). Objective  Body mass index is 30.41 kg/m. Wt Readings from Last 3 Encounters:  02/21/18 187 lb (84.8 kg)  08/24/17 193 lb 9.6 oz (87.8 kg)  12/30/16 200 lb 4 oz (90.8 kg)   Temp Readings from Last 3 Encounters:  02/21/18 98.5 F (36.9 C) (Oral)  08/24/17 98.3 F (36.8 C) (Oral)  12/30/16 98.2 F (36.8 C) (Oral)   BP Readings from Last 3 Encounters:  02/21/18 112/76  08/24/17 110/78  12/30/16 112/84   Pulse Readings from Last 3 Encounters:  02/21/18 74  08/24/17 83  12/30/16 68    Physical Exam  Constitutional: He is oriented to person, place, and time. Vital signs are normal. He appears well-developed and  well-nourished. He is cooperative.  HENT:  Head: Normocephalic and atraumatic.  Mouth/Throat: Oropharynx is clear and moist and mucous membranes are normal.  Eyes: Pupils are equal, round, and reactive to light. Conjunctivae are normal.  Cardiovascular: Normal rate, regular rhythm and normal heart sounds.  Pulmonary/Chest: Effort normal and breath sounds normal.  Neurological: He is alert and oriented to person, place, and time. Gait normal.  Skin: Skin is warm, dry and intact.  Tinea interdigital web toes  Dermatitis legs  Skin tags and benign nevi trunk   Psychiatric: He has a normal mood and affect. His speech is normal and behavior is normal. Judgment and thought content normal. Cognition and memory are normal.  Nursing note and vitals reviewed.   Assessment   1. HLD  2. H/o DVT 3. Skin tags, benign nevi, dermatitis, tinea pedis  4. HM Plan   1. Given cholesterol info  2. Will call back when needs prn Lovenox  3. Monitor  Trial of otc hc  Zeasorb af and otc antifungals  Call back if not better will Rx antifungal meds  4.  Declines flu shot  Tdap utp  Check mmr and hep B status and fasting labs before next f/u   rec healthy diet choices and get wt down  Declines STD check  rec stop vap THC   Provider: Dr. Olivia Mackie McLean-Scocuzza-[facility] Medicine

## 2018-04-08 IMAGING — US US EXTREM LOW VENOUS*L*
1 series · 13 of 24 positions shown · non-contrast
Comparison: None.

CLINICAL DATA: Acute onset of left leg swelling and pain. Initial
encounter.



[Series 1: us extrem low venous*left* · 0.08mm/px · 13 of 38 slices shown]
[im 1/38]
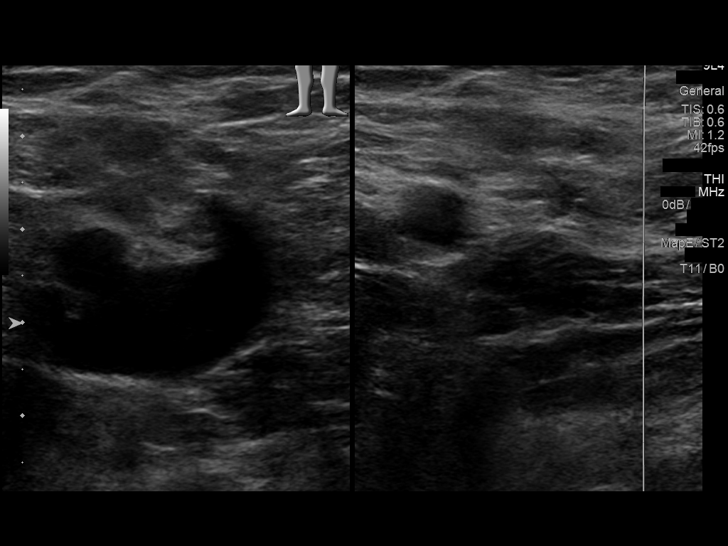
[im 4/38]
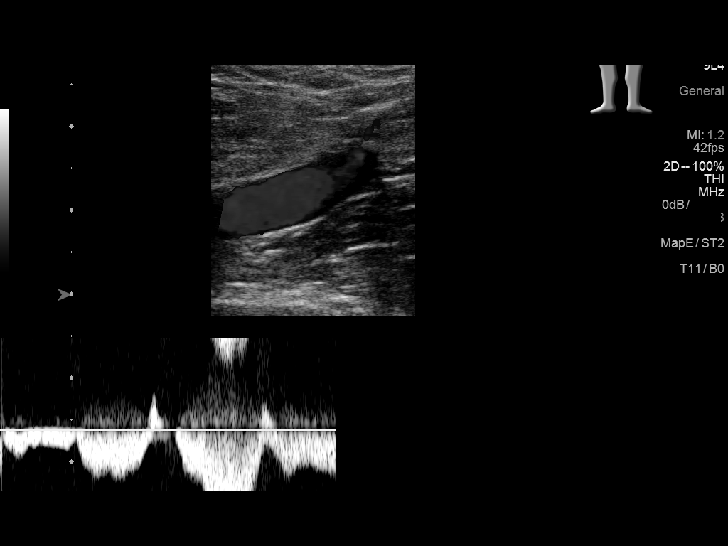
[im 7/38]
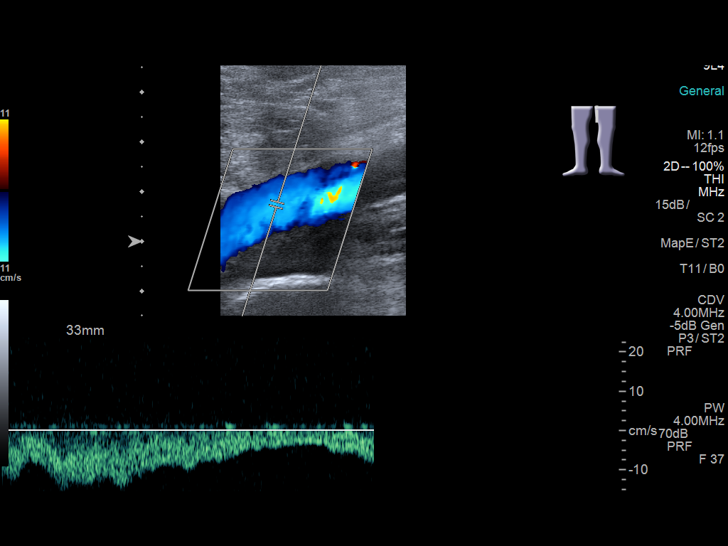
[im 10/38]
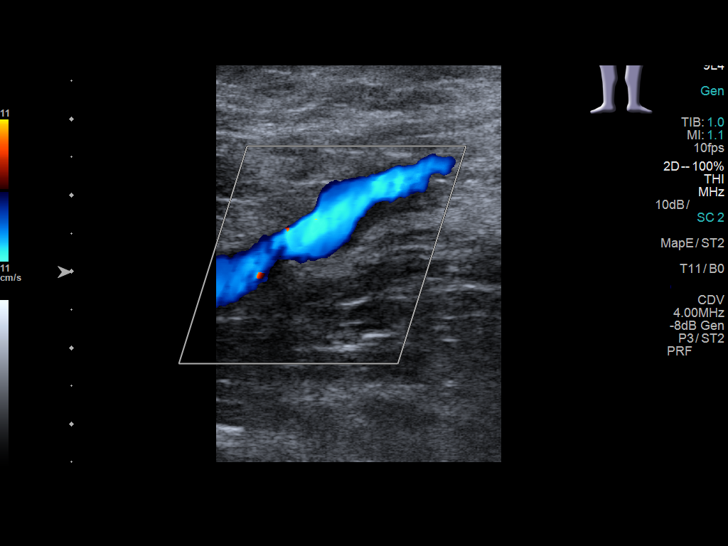
[im 13/38]
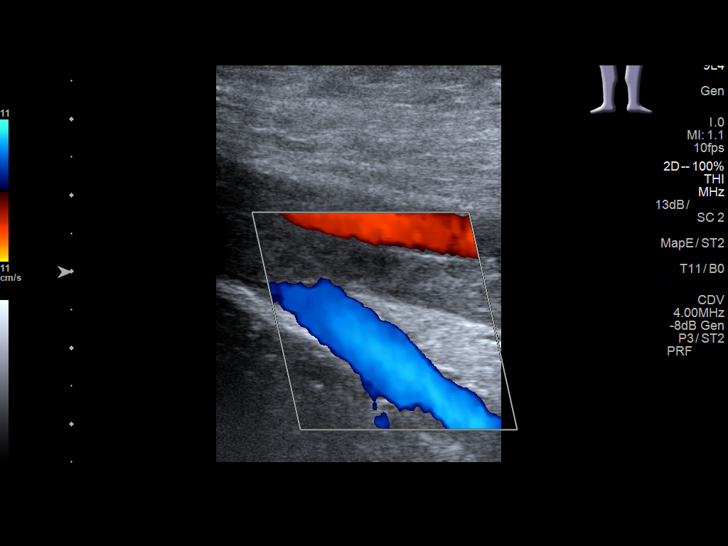
[im 17/38]
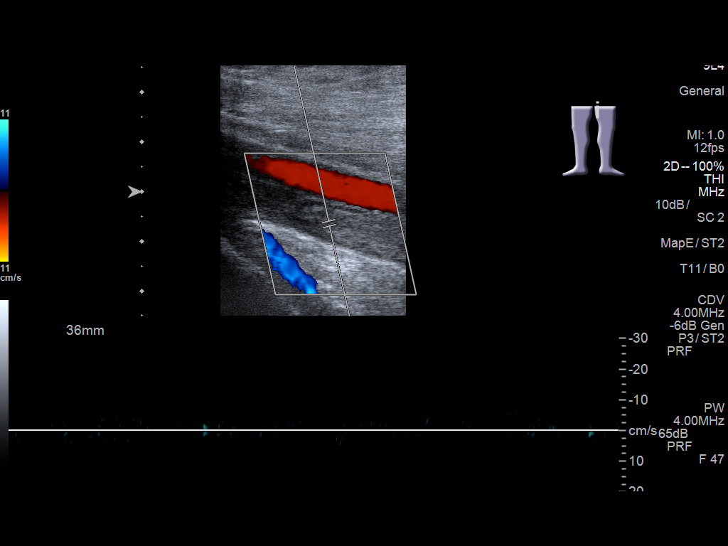
[im 20/38]
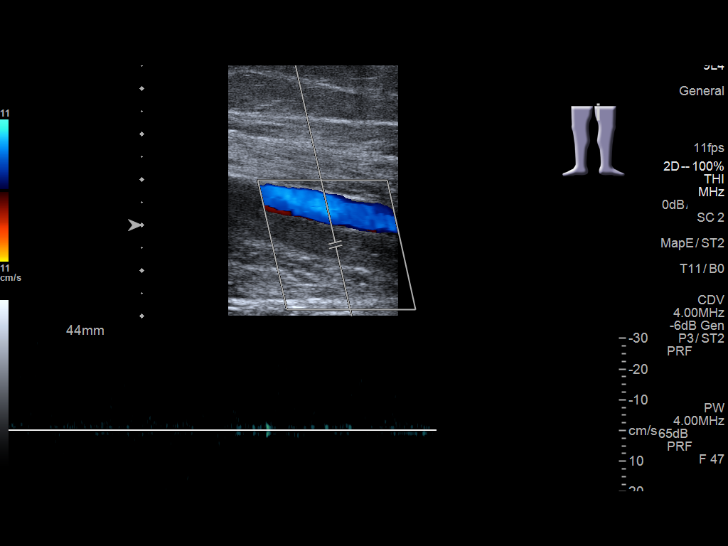
[im 21/38]
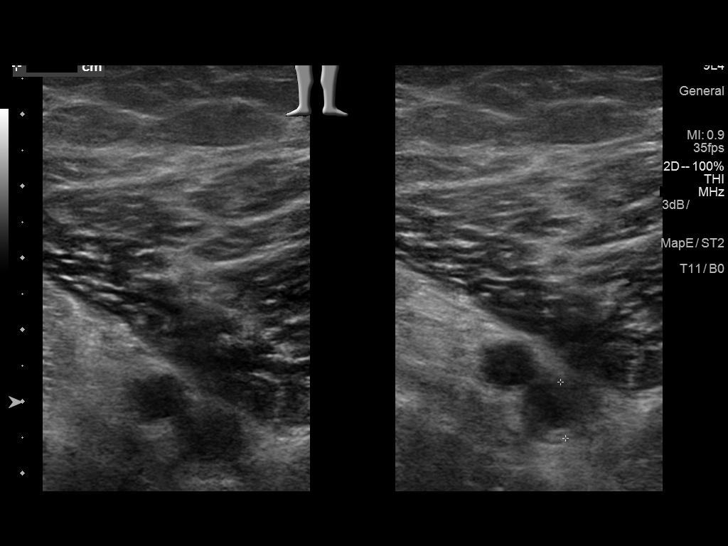
[im 25/38]
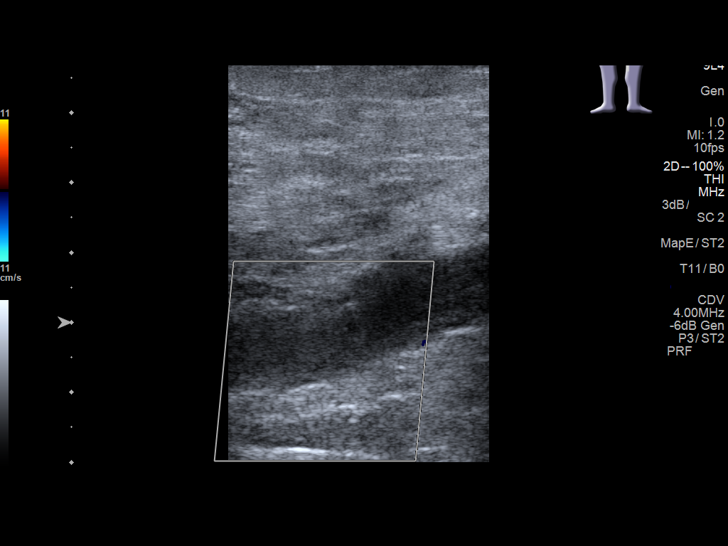
[im 28/38]
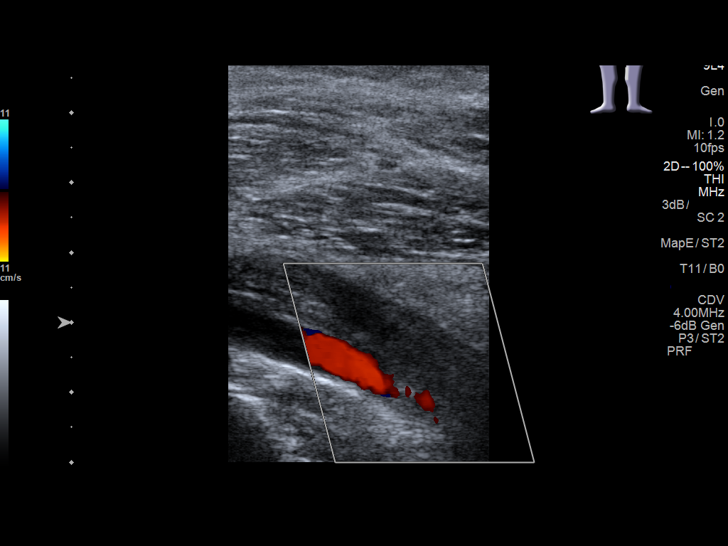
[im 31/38]
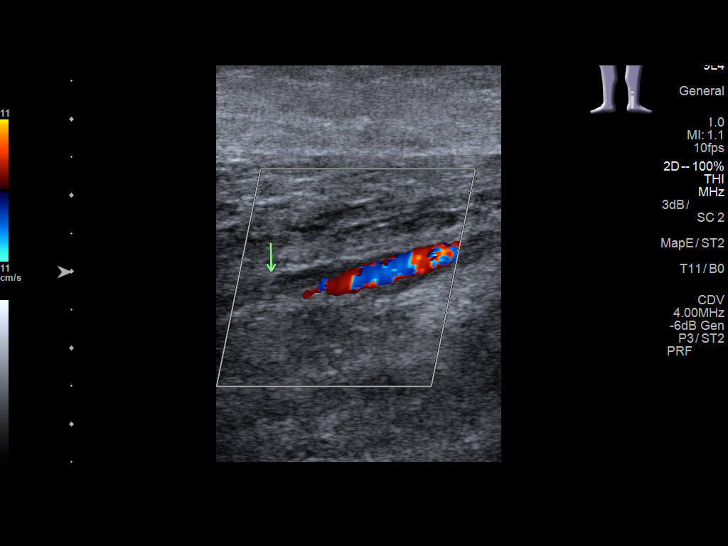
[im 34/38]
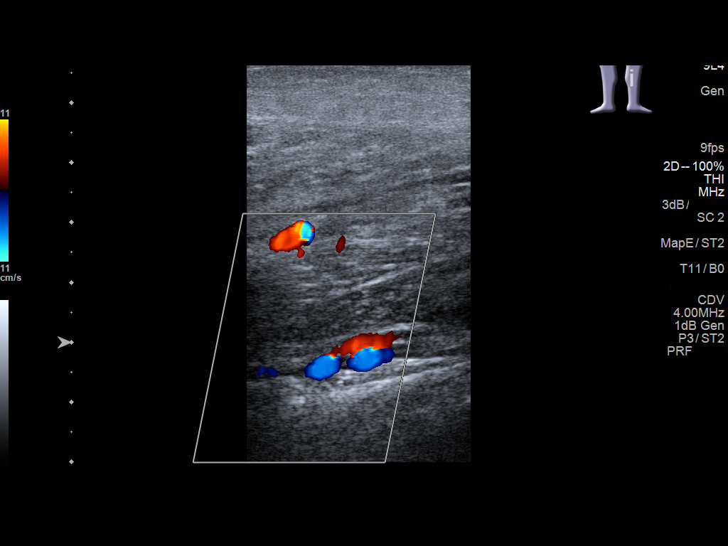
[im 38/38]
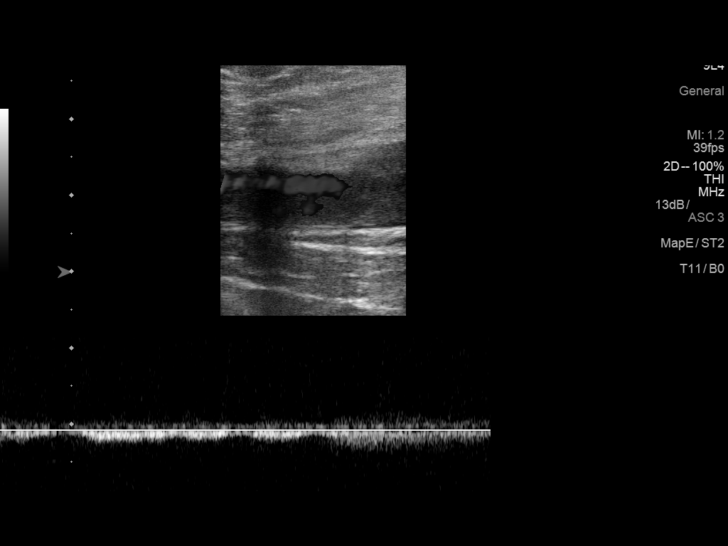

[13 of 24 positions shown; findings below may reference images not displayed]

FINDINGS: Contralateral Common Femoral Vein: Respiratory phasicity is normal
and symmetric with the symptomatic side. No evidence of thrombus.
Normal compressibility.

Common Femoral Vein: Nonocclusive thrombus is noted within the
distal left common femoral vein, just proximal to its origin.

Saphenofemoral Junction: No evidence of thrombus. Normal
compressibility and flow on color Doppler imaging.

Profunda Femoral Vein: No evidence of thrombus. Normal
compressibility and flow on color Doppler imaging.

Femoral Vein: Occlusive thrombus is noted within the left femoral
vein.

Popliteal Vein: Occlusive thrombus is noted within the left
popliteal vein.

Calf Veins: Occlusive thrombus is noted within the visualized
posterior tibial vein and one of the peroneal veins. The more
posterior peroneal vein appears patent.

Superficial Great Saphenous Vein: No evidence of thrombus. Normal
compressibility and flow on color Doppler imaging.

Venous Reflux:  None.

Other Findings:  None.
IMPRESSION: Occlusive deep venous thrombosis noted within the left femoral vein,
left popliteal vein and calf veins. Nonocclusive thrombus noted
within the distal left common femoral vein.

These results were called by telephone at the time of interpretation
on 04/23/2016 at [DATE] to Dr. LEONTY MURALLAS, who verbally
acknowledged these results.

## 2018-05-01 ENCOUNTER — Telehealth (INDEPENDENT_AMBULATORY_CARE_PROVIDER_SITE_OTHER): Payer: Self-pay | Admitting: Vascular Surgery

## 2018-05-01 NOTE — Telephone Encounter (Signed)
I inform the patient that I will need to speak to Eric Cunningham when he is back in the office and I will call once I receive an answer

## 2018-05-10 ENCOUNTER — Ambulatory Visit (INDEPENDENT_AMBULATORY_CARE_PROVIDER_SITE_OTHER): Payer: 59 | Admitting: Vascular Surgery

## 2018-05-10 ENCOUNTER — Encounter (INDEPENDENT_AMBULATORY_CARE_PROVIDER_SITE_OTHER): Payer: Self-pay | Admitting: Vascular Surgery

## 2018-05-10 VITALS — BP 123/78 | HR 68 | Resp 15 | Ht 66.0 in | Wt 192.0 lb

## 2018-05-10 DIAGNOSIS — Z86718 Personal history of other venous thrombosis and embolism: Secondary | ICD-10-CM

## 2018-05-10 DIAGNOSIS — E785 Hyperlipidemia, unspecified: Secondary | ICD-10-CM | POA: Diagnosis not present

## 2018-05-10 DIAGNOSIS — F17290 Nicotine dependence, other tobacco product, uncomplicated: Secondary | ICD-10-CM | POA: Diagnosis not present

## 2018-05-10 NOTE — Progress Notes (Signed)
MRN : [id number]  Eric Cunningham is a 42 y.o. ([birth year]) male who presents with chief complaint of  Chief Complaint  Patient presents with  . Follow-up    Check left leg filter  .  History of Present Illness:  The patient presents to the office for evaluation of DVT.  DVT was identified at [facility] by Duplex ultrasound.  The initial symptoms were pain and swelling in the lower extremity.  PROCEDURE 04/26/2016: 1. Ultrasound guidance for vascular access to the right common femoral vein and left popliteal vein 2. Catheter placement into the inferior vena cava for placement of IVC filter 3. Inferior venacavogram 4. Placement of a Ceslect IVC filter infrarenal 5. Introduction catheter into venous system second order catheter placement left popliteal approach 6. Infusion thrombolysis with 14 mg of TPA left lower extremity venous system 7. Mechanical thrombectomy of the popliteal, SFV common femoral vein using the Zelante catheter left lower extremity venous system  The patient notes the leg continues to be very painful with dependency and swells quite a bite.  Symptoms are much better with elevation.  The patient notes minimal edema in the morning which steadily worsens throughout the day.    The patient has not been using compression therapy at this point.  No SOB or pleuritic chest pains.  No cough or hemoptysis.  No blood per rectum or blood in any sputum.  No excessive bruising per the patient.       No outpatient medications have been marked as taking for the 05/10/18 encounter (Office Visit) with Delana Meyer, Dolores Lory, MD.    Past Medical History:  Diagnosis Date  . Asthma   . Chicken pox   . DVT (deep venous thrombosis) ([place])   . History of blood transfusion   . 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 Hyperlipidemia     Past Surgical History:  Procedure Laterality Date  . PERIPHERAL VASCULAR  CATHETERIZATION Left 04/26/2016   Procedure: Lower Extremity Venography and thrombectomy;  Surgeon: Katha Cabal, MD;  Location: [place] [facility];  Service: Cardiovascular;  Laterality: Left;  . removal of dvt Left     Social History Social History   Tobacco Use  . Smoking status: Light Tobacco Smoker    Types: Cigars  . Smokeless tobacco: Never Used  Substance Use Topics  . Alcohol use: Yes    Alcohol/week: 6.0 standard drinks    Types: 6 Cans of beer per week    Comment: occasional, once a week  . Drug use: Yes    Types: Marijuana    Comment: vaps THC    Family History Family History  Problem Relation Age of Onset  . Clotting disorder Mother   . Cancer Paternal Uncle     Allergies  Allergen Reactions  . Ativan [Lorazepam] Other (See Comments)    Patient states he was very anxious and very hyper.  . Penicillins Other (See Comments)    Patient states his mother said he was allergic at birth. Unknown reation     REVIEW OF SYSTEMS (Negative unless checked)  Constitutional: [] Weight loss  [] Fever  [] Chills Cardiac: [] Chest pain   [] Chest pressure   [] Palpitations   [] Shortness of breath when laying flat   [] Shortness of breath with exertion. Vascular:  [] Pain in legs with walking   [] Pain in legs at rest  [x] History of DVT   [] Phlebitis   [  x]Swelling in legs   [] Varicose veins   [] Non-healing ulcers Pulmonary:   [] Uses home oxygen   [] Productive cough   [] Hemoptysis   [] Wheeze  [] COPD   [] Asthma Neurologic:  [] Dizziness   [] Seizures   [] History of stroke   [] History of TIA  [] Aphasia   [] Vissual changes   [] Weakness or numbness in arm   [] Weakness or numbness in leg Musculoskeletal:   [] Joint swelling   [] Joint pain   [] Low back pain Hematologic:  [] Easy bruising  [] Easy bleeding   [] Hypercoagulable state   [] Anemic Gastrointestinal:  [] Diarrhea   [] Vomiting  [] Gastroesophageal reflux/heartburn   [] Difficulty swallowing. Genitourinary:  [] Chronic kidney disease    [] Difficult urination  [] Frequent urination   [] Blood in urine Skin:  [] Rashes   [] Ulcers  Psychological:  [] History of anxiety   []  History of major depression.  Physical Examination  Vitals:   05/10/18 0858  BP: 123/78  Pulse: 68  Resp: 15  Weight: 192 lb (87.1 kg)  Height: 5\' 6"  (1.676 m)   Body mass index is 30.99 kg/m. Gen: WD/WN, NAD Head: [place]/AT, No temporalis wasting.  Ear/Nose/Throat: Hearing grossly intact, nares w/o erythema or drainage Eyes: PER, EOMI, sclera nonicteric.  Neck: Supple, no large masses.   Pulmonary:  Good air movement, no audible wheezing bilaterally, no use of accessory muscles.  Cardiac: RRR, no JVD Vascular: scattered varicosities present bilaterally.  Mild venous stasis changes to the legs bilaterally.  2-3+ soft pitting edema of the left leg Vessel Right Left  Radial Palpable Palpable  PT Palpable Palpable  DP Palpable Palpable  Gastrointestinal: Non-distended. No guarding/no peritoneal signs.  Musculoskeletal: M/S 5/5 throughout.  No deformity or atrophy.  Neurologic: CN 2-12 intact. Symmetrical.  Speech is fluent. Motor exam as listed above. Psychiatric: Judgment intact, Mood & affect appropriate for pt's clinical situation. Dermatologic: venous rashes no ulcers noted.  No changes consistent with cellulitis. Lymph : No lichenification or skin changes of chronic lymphedema.  CBC Lab Results  Component Value Date   WBC 5.4 12/30/2016   HGB 15.4 12/30/2016   HCT 45.8 12/30/2016   MCV 90.0 12/30/2016   PLT 323.0 12/30/2016    BMET    Component Value Date/Time   NA 139 12/30/2016 1127   K 4.4 12/30/2016 1127   CL 105 12/30/2016 1127   CO2 27 12/30/2016 1127   GLUCOSE 106 (H) 12/30/2016 1127   BUN 17 12/30/2016 1127   CREATININE 0.84 12/30/2016 1127   CALCIUM 9.7 12/30/2016 1127   GFRNONAA >60 04/26/2016 0454   GFRAA >60 04/26/2016 0454   CrCl cannot be calculated (Patient's most recent lab result is older than the maximum 21 days  allowed.).  COAG Lab Results  Component Value Date   INR 0.92 04/23/2016    Radiology No results found.    Assessment/Plan 1. History of DVT (deep vein thrombosis) Recommend:    IVC filter is in place we will evaluate it with ultrasound and I have discussed removal with him.  He wishes to see what the ultrasound shows and will consider removal.  The patient is taking 325 mg of ASA as prophylaxis  Elevation was stressed, use of a recliner was discussed.  I have had a long discussion with the patient regarding DVT and post phlebitic changes such as swelling and why it  causes symptoms such as pain.  The patient will wear graduated compression stockings class 1 (20-30 mmHg), beginning after three full days of anticoagulation, on a daily basis a  prescription was given. The patient will  beginning wearing the stockings first thing in the morning and removing them in the evening. The patient is instructed specifically not to sleep in the stockings.  In addition, behavioral modification including elevation during the day and avoidance of prolonged dependency will be initiated.    The patient will continue anticoagulation for now as there have not been any problems or complications at this point.   - VAS US AORTA/IVC/ILIACS; Future  2. Hyperlipidemia, unspecified hyperlipidemia type Continue statin as ordered and reviewed, no changes at this time    Hortencia Pilar, MD  05/10/2018 9:16 AM

## 2018-05-31 ENCOUNTER — Other Ambulatory Visit (INDEPENDENT_AMBULATORY_CARE_PROVIDER_SITE_OTHER): Payer: Self-pay | Admitting: Vascular Surgery

## 2018-05-31 ENCOUNTER — Ambulatory Visit (INDEPENDENT_AMBULATORY_CARE_PROVIDER_SITE_OTHER): Payer: 59

## 2018-05-31 ENCOUNTER — Encounter (INDEPENDENT_AMBULATORY_CARE_PROVIDER_SITE_OTHER): Payer: Self-pay

## 2018-05-31 ENCOUNTER — Encounter (INDEPENDENT_AMBULATORY_CARE_PROVIDER_SITE_OTHER): Payer: Self-pay | Admitting: Vascular Surgery

## 2018-05-31 ENCOUNTER — Ambulatory Visit (INDEPENDENT_AMBULATORY_CARE_PROVIDER_SITE_OTHER): Payer: 59 | Admitting: Vascular Surgery

## 2018-05-31 VITALS — BP 124/81 | HR 72 | Resp 16 | Ht 66.0 in | Wt 196.0 lb

## 2018-05-31 DIAGNOSIS — E785 Hyperlipidemia, unspecified: Secondary | ICD-10-CM

## 2018-05-31 DIAGNOSIS — Z95828 Presence of other vascular implants and grafts: Secondary | ICD-10-CM | POA: Diagnosis not present

## 2018-05-31 DIAGNOSIS — Z86718 Personal history of other venous thrombosis and embolism: Secondary | ICD-10-CM | POA: Diagnosis not present

## 2018-05-31 NOTE — Progress Notes (Signed)
MRN : [id number]  Eric Cunningham is a 42 y.o. (1976-02-03) male who presents with chief complaint of  Chief Complaint  Patient presents with  . Follow-up    Iliac u/s follow up  .  History of Present Illness:   The patient presents to the office for follow up evaluation of DVT.  DVT was identified remotely at [facility] by Duplex ultrasound.  The initial symptoms were pain and swelling in the lower extremity.  PROCEDURE 04/26/2016: 1. Ultrasound guidance for vascular access to the right common femoral vein and left popliteal vein 2. Catheter placement into the inferior vena cava for placement of IVC filter 3. Inferior venacavogram 4. Placement of a Ceslect IVC filter infrarenal 5. Introduction catheter into venous system second order catheter placement left popliteal approach 6. Infusion thrombolysis with 14mg  of TPAleft lower extremity venous system 7. Mechanical thrombectomy of the popliteal, SFV common femoral vein using the Zelante catheterleft lower extremity venous system  The patient notes his leg is doing fine with minimal pain and some swelling.  Symptoms are much better with elevation.  The patient notes minimal edema in the morning which steadily worsens throughout the day.    The patient has not been using compression therapy at this point.  No SOB or pleuritic chest pains.  No cough or hemoptysis.  No blood per rectum or blood in any sputum.  No excessive bruising per the patient.  Duplex ultrasound shows normal venous flow  No outpatient medications have been marked as taking for the 05/31/18 encounter (Office Visit) with Delana Meyer, Dolores Lory, MD.    Past Medical History:  Diagnosis Date  . Asthma   . Chicken pox   . DVT (deep venous thrombosis) ([place])   . History of blood transfusion   . 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 Hyperlipidemia     Past Surgical History:  Procedure  Laterality Date  . PERIPHERAL VASCULAR CATHETERIZATION Left 04/26/2016   Procedure: Lower Extremity Venography and thrombectomy;  Surgeon: Katha Cabal, MD;  Location: [place] [facility];  Service: Cardiovascular;  Laterality: Left;  . removal of dvt Left     Social History Social History   Tobacco Use  . Smoking status: Light Tobacco Smoker    Types: Cigars  . Smokeless tobacco: Never Used  Substance Use Topics  . Alcohol use: Yes    Alcohol/week: 6.0 standard drinks    Types: 6 Cans of beer per week    Comment: occasional, once a week  . Drug use: Yes    Types: Marijuana    Comment: vaps THC    Family History Family History  Problem Relation Age of Onset  . Clotting disorder Mother   . Cancer Paternal Uncle     Allergies  Allergen Reactions  . Ativan [Lorazepam] Other (See Comments)    Patient states he was very anxious and very hyper.  . Penicillins Other (See Comments)    Patient states his mother said he was allergic at birth. Unknown reation     REVIEW OF SYSTEMS (Negative unless checked)  Constitutional: [] Weight loss  [] Fever  [] Chills Cardiac: [] Chest pain   [] Chest pressure   [] Palpitations   [] Shortness of breath when laying flat   [] Shortness of breath with exertion. Vascular:  [] Pain in legs with walking   [] Pain in legs at rest  [x] History of DVT   [] Phlebitis   [  x]Swelling in legs   [] Varicose veins   [] Non-healing ulcers Pulmonary:   [] Uses home oxygen   [] Productive cough   [] Hemoptysis   [] Wheeze  [] COPD   [] Asthma Neurologic:  [] Dizziness   [] Seizures   [] History of stroke   [] History of TIA  [] Aphasia   [] Vissual changes   [] Weakness or numbness in arm   [] Weakness or numbness in leg Musculoskeletal:   [] Joint swelling   [] Joint pain   [] Low back pain Hematologic:  [] Easy bruising  [] Easy bleeding   [] Hypercoagulable state   [] Anemic Gastrointestinal:  [] Diarrhea   [] Vomiting  [] Gastroesophageal reflux/heartburn   [] Difficulty  swallowing. Genitourinary:  [] Chronic kidney disease   [] Difficult urination  [] Frequent urination   [] Blood in urine Skin:  [] Rashes   [] Ulcers  Psychological:  [] History of anxiety   []  History of major depression.  Physical Examination  Vitals:   05/31/18 1005  BP: 124/81  Pulse: 72  Resp: 16  Weight: 196 lb (88.9 kg)  Height: 5\' 6"  (1.676 m)   Body mass index is 31.64 kg/m. Gen: WD/WN, NAD Head: [place]/AT, No temporalis wasting.  Ear/Nose/Throat: Hearing grossly intact, nares w/o erythema or drainage Eyes: PER, EOMI, sclera nonicteric.  Neck: Supple, no large masses.   Pulmonary:  Good air movement, no audible wheezing bilaterally, no use of accessory muscles.  Cardiac: RRR, no JVD Vascular: scattered varicosities present bilaterally.  Mild venous stasis changes to the legs bilaterally.  2+ soft pitting edema Vessel Right Left  Radial Palpable Palpable  Gastrointestinal: Non-distended. No guarding/no peritoneal signs.  Musculoskeletal: M/S 5/5 throughout.  No deformity or atrophy.  Neurologic: CN 2-12 intact. Symmetrical.  Speech is fluent. Motor exam as listed above. Psychiatric: Judgment intact, Mood & affect appropriate for pt's clinical situation. Dermatologic: Mild venous rashes no ulcers noted.  No changes consistent with cellulitis. Lymph : No lichenification or skin changes of chronic lymphedema.  CBC Lab Results  Component Value Date   WBC 5.4 12/30/2016   HGB 15.4 12/30/2016   HCT 45.8 12/30/2016   MCV 90.0 12/30/2016   PLT 323.0 12/30/2016    BMET    Component Value Date/Time   NA 139 12/30/2016 1127   K 4.4 12/30/2016 1127   CL 105 12/30/2016 1127   CO2 27 12/30/2016 1127   GLUCOSE 106 (H) 12/30/2016 1127   BUN 17 12/30/2016 1127   CREATININE 0.84 12/30/2016 1127   CALCIUM 9.7 12/30/2016 1127   GFRNONAA >60 04/26/2016 0454   GFRAA >60 04/26/2016 0454   CrCl cannot be calculated (Patient's most recent lab result is older than the maximum 21 days  allowed.).  COAG Lab Results  Component Value Date   INR 0.92 04/23/2016    Radiology No results found.   Assessment/Plan 1. History of DVT (deep vein thrombosis) The patient will continue ASA for now as there have not been any problems or complications at this point.  IVC filter will be removed.  Risk and benefits were reviewed the patient.  Indications for the procedure were reviewed.  All questions were answered, the patient agrees to proceed.   I have had a long discussion with the patient regarding DVT and post phlebitic changes such as swelling and why it  causes symptoms such as pain.  The patient will wear graduated compression stockings class 1 (20-30 mmHg) on a daily basis a prescription was given. The patient will  beginning wearing the stockings first thing in the morning and removing them in the evening. The patient  is instructed specifically not to sleep in the stockings.  In addition, behavioral modification including elevation during the day and avoidance of prolonged dependency will be initiated.    The patient will follow-up with meafter the procedure  2. Hyperlipidemia, unspecified hyperlipidemia type Continue statin as ordered and reviewed, no changes at this time   Hortencia Pilar, MD  05/31/2018 10:07 AM

## 2018-06-11 ENCOUNTER — Other Ambulatory Visit (INDEPENDENT_AMBULATORY_CARE_PROVIDER_SITE_OTHER): Payer: Self-pay | Admitting: Nurse Practitioner

## 2018-06-11 MED ORDER — CLINDAMYCIN PHOSPHATE 300 MG/50ML IV SOLN: 300.0000 mg | Freq: Once | INTRAVENOUS | Status: DC

## 2018-06-12 ENCOUNTER — Ambulatory Visit
Admission: RE | Admit: 2018-06-12 | Discharge: 2018-06-12 | Disposition: A | Discharge status: Home / Self Care | Payer: 59 | Source: Ambulatory Visit | Attending: Vascular Surgery | Admitting: Vascular Surgery

## 2018-06-12 ENCOUNTER — Encounter
Admission: RE | Disposition: A | Discharge status: Home / Self Care | Payer: Self-pay | Source: Ambulatory Visit | Attending: Vascular Surgery

## 2018-06-12 ENCOUNTER — Other Ambulatory Visit: Payer: Self-pay

## 2018-06-12 DIAGNOSIS — Z86718 Personal history of other venous thrombosis and embolism: Secondary | ICD-10-CM | POA: Diagnosis not present

## 2018-06-12 DIAGNOSIS — M7989 Other specified soft tissue disorders: Secondary | ICD-10-CM | POA: Insufficient documentation

## 2018-06-12 DIAGNOSIS — Z888 Allergy status to other drugs, medicaments and biological substances status: Secondary | ICD-10-CM | POA: Diagnosis not present

## 2018-06-12 DIAGNOSIS — F1210 Cannabis abuse, uncomplicated: Secondary | ICD-10-CM | POA: Diagnosis not present

## 2018-06-12 DIAGNOSIS — E785 Hyperlipidemia, unspecified: Secondary | ICD-10-CM | POA: Diagnosis not present

## 2018-06-12 DIAGNOSIS — Z832 Family history of diseases of the blood and blood-forming organs and certain disorders involving the immune mechanism: Secondary | ICD-10-CM | POA: Insufficient documentation

## 2018-06-12 DIAGNOSIS — Z8619 Personal history of other infectious and parasitic diseases: Secondary | ICD-10-CM | POA: Diagnosis not present

## 2018-06-12 DIAGNOSIS — Z880 Allergy status to penicillin: Secondary | ICD-10-CM | POA: Diagnosis not present

## 2018-06-12 DIAGNOSIS — Z9889 Other specified postprocedural states: Secondary | ICD-10-CM | POA: Diagnosis not present

## 2018-06-12 DIAGNOSIS — F17210 Nicotine dependence, cigarettes, uncomplicated: Secondary | ICD-10-CM | POA: Diagnosis not present

## 2018-06-12 DIAGNOSIS — I2699 Other pulmonary embolism without acute cor pulmonale: Secondary | ICD-10-CM | POA: Diagnosis not present

## 2018-06-12 DIAGNOSIS — I82401 Acute embolism and thrombosis of unspecified deep veins of right lower extremity: Secondary | ICD-10-CM

## 2018-06-12 DIAGNOSIS — I82409 Acute embolism and thrombosis of unspecified deep veins of unspecified lower extremity: Secondary | ICD-10-CM | POA: Diagnosis not present

## 2018-06-12 HISTORY — PX: IVC FILTER REMOVAL: CATH118246

## 2018-06-12 SURGERY — IVC FILTER REMOVAL
Anesthesia: Moderate Sedation

## 2018-06-12 MED ORDER — FENTANYL CITRATE (PF) 100 MCG/2ML IJ SOLN
INTRAMUSCULAR | Status: AC
  Filled 2018-06-12: qty 2

## 2018-06-12 MED ORDER — CLINDAMYCIN PHOSPHATE 300 MG/50ML IV SOLN
INTRAVENOUS | Status: AC
  Filled 2018-06-12: qty 50

## 2018-06-12 MED ORDER — HYDROMORPHONE HCL 1 MG/ML IJ SOLN: 1.0000 mg | Freq: Once | INTRAMUSCULAR | Status: DC | PRN

## 2018-06-12 MED ORDER — ONDANSETRON HCL 4 MG/2ML IJ SOLN: 4.0000 mg | Freq: Four times a day (QID) | INTRAMUSCULAR | Status: DC | PRN

## 2018-06-12 MED ORDER — IOPAMIDOL (ISOVUE-300) INJECTION 61%
INTRAVENOUS | Status: DC | PRN
  Administered 2018-06-12: 15 mL via INTRAVENOUS

## 2018-06-12 MED ORDER — FENTANYL CITRATE (PF) 100 MCG/2ML IJ SOLN
INTRAMUSCULAR | Status: DC | PRN
  Administered 2018-06-12: 25 ug via INTRAVENOUS
  Administered 2018-06-12: 50 ug via INTRAVENOUS
  Administered 2018-06-12: 25 ug via INTRAVENOUS

## 2018-06-12 MED ORDER — CLINDAMYCIN PHOSPHATE 300 MG/50ML IV SOLN
INTRAVENOUS | Status: AC
  Administered 2018-06-12: 14:00:00
  Filled 2018-06-12: qty 50

## 2018-06-12 MED ORDER — SODIUM CHLORIDE 0.9 % IV SOLN
INTRAVENOUS | Status: DC
  Administered 2018-06-12: 1000 mL via INTRAVENOUS

## 2018-06-12 MED ORDER — MIDAZOLAM HCL 5 MG/5ML IJ SOLN
INTRAMUSCULAR | Status: AC
  Filled 2018-06-12: qty 5

## 2018-06-12 MED ORDER — LIDOCAINE HCL (PF) 1 % IJ SOLN
INTRAMUSCULAR | Status: AC
  Filled 2018-06-12: qty 30

## 2018-06-12 MED ORDER — MIDAZOLAM HCL 2 MG/2ML IJ SOLN
INTRAMUSCULAR | Status: DC | PRN
  Administered 2018-06-12: 1 mg via INTRAVENOUS
  Administered 2018-06-12: 2 mg via INTRAVENOUS
  Administered 2018-06-12: 1 mg via INTRAVENOUS

## 2018-06-12 SURGICAL SUPPLY — 3 items
PACK ANGIOGRAPHY (CUSTOM PROCEDURE TRAY) ×3 IMPLANT
SET VENACAVA FILTER RETRIEVAL (MISCELLANEOUS) ×3 IMPLANT
WIRE J 3MM .035X145CM (WIRE) ×3 IMPLANT

## 2018-06-12 NOTE — Discharge Instructions (Signed)
Inferior Vena Cava Filter Removal, Care After °This sheet gives you information about how to care for yourself after your procedure. Your health care provider may also give you more specific instructions. If you have problems or questions, contact your health care provider. °What can I expect after the procedure? °After your procedure, it is common to have: °· Mild pain in the area where the filter was removed. °· Mild bruising in the area where the filter was removed. ° °Follow these instructions at home: °Removal  site care °· Follow instructions from your health care provider about how to take care of the site where a catheter was removed at your neck.. Make sure you: °? Wash your hands with soap and water before you change your bandage (dressing). If soap and water are not available, use hand sanitizer. °? Change your dressing as told by your health care provider. °· Check your removal site every day for signs of infection. Check for: °? More redness, swelling, or pain. °? More fluid or blood. °? Warmth. °? Pus or a bad smell. °· Keep the removal site clean and dry. °· Do not shower, bathe, use a hot tub, or let the dressing get wet until your health care provider approves. °General instructions °· Take over-the-counter and prescription medicines only as told by your health care provider. °· Avoid heavy lifting or hard activities for 48 hours after the procedure or as told by your health care provider. °· Do not drive for 24 hours if you were given a a medicine to help you relax (sedative). °· Do not drive or use heavy machinery while taking prescription pain medicine. °· Do not go back to school or work until your health care provider approves. °· Keep all follow-up visits as told by your health care provider. This is important. °Contact a health care provider if: °· You have more redness, swelling, or pain around your removal site. °· You have more fluid or blood coming from you removalr site. °· Your removal  site feels warm to the touch. °· You have pus or a bad smell coming from your  removal site. °· You have a fever. °· You are dizzy. °· You have nausea and vomiting. °· You develop a rash. °Get help right away if: °· You develop chest pain, a cough, or difficulty breathing. °· You develop shortness of breath, feel faint, or pass out. °· You cough up blood. °· You have severe pain in your abdomen. °· You develop swelling and discoloration or pain in your legs. °· Your legs become pale and cold or blue. °· You develop weakness, difficulty moving your arms or legs, or balance problems. °· You develop problems with speech or vision. °These symptoms may represent a serious problem that is an emergency. Do not wait to see if the symptoms will go away. Get medical help right away. Call your local emergency services (911 in the [place] Do not drive yourself to the hospital. °Summary °· After your removal procedure, it is common to have mild pain and bruising. °· Do not shower, bathe, use a hot tub, or let the dressing get wet until your health care provider approves. °Every day, check for signs of infection where a catheter was removalGroin Insertion Instructions-If you lose feeling or develop tingling or pain in your leg or foot after the procedure, please walk around first.  If the discomfort does not improve , contact your physician and proceed to the nearest emergency room.  Loss of   feeling in your leg might mean that a blockage has formed in the artery and this can be appropriately treated.  Limit your activity for the next two days after your procedure.  Avoid stooping, bending, heavy lifting or exertion as this may put pressure on the insertion site.  Resume normal activities in 48 hours.  You may shower after 24 hours but avoid excessive warm water and do not scrub the site.  Remove clear dressing in 48 hours.  If you have had a closure device inserted, do not soak in a tub bath or a hot tub for at least one  week. ° °No driving for 48 hours after discharge.  After the procedure, check the insertion site occasionally.  If any oozing occurs or there is apparent swelling, firm pressure over the site will prevent a bruise from forming.  You can not hurt anything by pressing directly on the site.  The pressure stops the bleeding by allowing a small clot to form.  If the bleeding continues after the pressure has been applied for more than 15 minutes, call 911 or go to the nearest emergency room.   ° °The x-ray dye causes you to pass a considerate amount of urine.  For this reason, you will be asked to drink plenty of liquids after the procedure to prevent dehydration.  You may resume you regular diet.  Avoid caffeine products.   ° °For pain at the site of your procedure, take non-aspirin medicines such as Tylenol. ° °· Medications: A. Hold Metformin for 48 hours if applicable.  B. Continue taking all your present medications at home unless your doctor prescribes any changes. at your neck or groin (insertion site). °This information is not intended to replace advice given to you by your health care provider. Make sure you discuss any questions you have with your health care provider. °Document Released: 05/01/2013 Document Revised: 06/01/2016 Document Reviewed: 06/01/2016 °Elsevier Interactive Patient Education © 2017 [employer] ° °

## 2018-06-12 NOTE — H&P (Signed)
[facility] History & Physical Update  The patient was interviewed and re-examined.  The patient's previous History and Physical has been reviewed and is unchanged.  There is no change in the plan of care. We plan to proceed with the scheduled procedure.  Hortencia Pilar, MD  06/12/2018, 9:56 AM

## 2018-06-12 NOTE — Op Note (Signed)
  OPERATIVE NOTE   PRE-OPERATIVE DIAGNOSIS: DVT with PE  POST-OPERATIVE DIAGNOSIS: Same  PROCEDURE: 1. Retrieval of IVC Filter 2. Inferior Vena Cavagram  SURGEON: Katha Cabal, M.D.  ANESTHESIA:  Conscious sedation was administered under my direct supervision by the interventional radiology RN. IV Versed plus fentanyl were utilized. Continuous ECG, pulse oximetry and blood pressure was monitored throughout the entire procedure. Conscious sedation was for a total of 25 minutes.  ESTIMATED BLOOD LOSS: Minimal cc  FINDING(S):inferior vena cava is widely patent filter is in place in good position. Filter is removed without incident  SPECIMEN(S):  IVC filter intact  INDICATIONS:   Eric Cunningham is a 42 y.o. male who presents with DVT and PE. The patient has now tolerated anticoagulation for several months. Therefore, the IVC filter is recommended to be removed. The risks and benefits were reviewed with the patient all questions were answered and they agreed to proceed with IVC filter retrieval. Oral anticoagulation will be continued.  DESCRIPTION: After obtaining full informed written consent, the patient was brought back to the Special Procedure Suite and placed in the supine position.  The patient received IV antibiotics prior to induction.  After obtaining adequate sedation, the patient was prepped and draped in the standard fashion and appropriate time out is called.     Ultrasound was placed in a sterile sleeve.The right neck was then imaged with ultrasound.   Jugular vein was identified it is echolucent and homogeneous indicating patency. 1% lidocaine is then infiltrated under ultrasound visualization and subsequently a Seldinger needle is inserted under real-time ultrasound guidance.  J-wire is then advanced into the inferior vena cava under fluoroscopic guidance. With the tip of the sheath at the confluence of the iliac veins inferior vena caval imaging is  performed.  After review of the image the sheath is repositioned to above the filter and the snares introduced. Snares opened and the hook is secured without difficulty. The filter is then collapsed within the sheath and removed without difficulty.  Sheath is removed by pressures held the patient tolerated the procedure well and there were no immediate complications.  Interpretation: inferior vena cava is widely patent filter is in place in good position. Filter is removed without incident.     COMPLICATIONS: None  CONDITION: Eric Cunningham, M.D. [facility] Office: [phone]   06/12/2018, 2:34 PM

## 2018-12-21 ENCOUNTER — Other Ambulatory Visit: Payer: Self-pay

## 2018-12-21 ENCOUNTER — Other Ambulatory Visit (INDEPENDENT_AMBULATORY_CARE_PROVIDER_SITE_OTHER): Payer: 59

## 2018-12-21 ENCOUNTER — Encounter: Payer: Self-pay | Admitting: Internal Medicine

## 2018-12-21 ENCOUNTER — Other Ambulatory Visit: Payer: Self-pay | Admitting: Internal Medicine

## 2018-12-21 DIAGNOSIS — Z1159 Encounter for screening for other viral diseases: Secondary | ICD-10-CM

## 2018-12-21 DIAGNOSIS — E559 Vitamin D deficiency, unspecified: Secondary | ICD-10-CM

## 2018-12-21 DIAGNOSIS — Z1389 Encounter for screening for other disorder: Secondary | ICD-10-CM

## 2018-12-21 DIAGNOSIS — Z0000 Encounter for general adult medical examination without abnormal findings: Secondary | ICD-10-CM

## 2018-12-21 DIAGNOSIS — Z0184 Encounter for antibody response examination: Secondary | ICD-10-CM | POA: Diagnosis not present

## 2018-12-21 DIAGNOSIS — E785 Hyperlipidemia, unspecified: Secondary | ICD-10-CM

## 2018-12-21 DIAGNOSIS — Z1329 Encounter for screening for other suspected endocrine disorder: Secondary | ICD-10-CM

## 2018-12-21 LAB — COMPREHENSIVE METABOLIC PANEL
ALT: 17 U/L (ref 0–53)
AST: 16 U/L (ref 0–37)
Albumin: 4.2 g/dL (ref 3.5–5.2)
Alkaline Phosphatase: 66 U/L (ref 39–117)
BUN: 13 mg/dL (ref 6–23)
CO2: 26 mEq/L (ref 19–32)
Calcium: 9 mg/dL (ref 8.4–10.5)
Chloride: 104 mEq/L (ref 96–112)
Creatinine, Ser: 0.75 mg/dL (ref 0.40–1.50)
GFR: 112.92 mL/min (ref >60.00)
Glucose, Bld: 98 mg/dL (ref 70–99)
Potassium: 4.5 mEq/L (ref 3.5–5.1)
Sodium: 138 mEq/L (ref 135–145)
Total Bilirubin: 0.4 mg/dL (ref 0.2–1.2)
Total Protein: 6.8 g/dL (ref 6.0–8.3)

## 2018-12-21 LAB — CBC WITH DIFFERENTIAL/PLATELET
Basophils Absolute: 0.1 10*3/uL (ref 0.0–0.1)
Basophils Relative: 1.1 % (ref 0.0–3.0)
Eosinophils Absolute: 0.1 10*3/uL (ref 0.0–0.7)
Eosinophils Relative: 2.2 % (ref 0.0–5.0)
HCT: 44 % (ref 39.0–52.0)
Hemoglobin: 15.2 g/dL (ref 13.0–17.0)
Lymphocytes Relative: 26.1 % (ref 12.0–46.0)
Lymphs Abs: 1.5 10*3/uL (ref 0.7–4.0)
MCHC: 34.6 g/dL (ref 30.0–36.0)
MCV: 90.1 fl (ref 78.0–100.0)
Monocytes Absolute: 0.5 10*3/uL (ref 0.1–1.0)
Monocytes Relative: 9.5 % (ref 3.0–12.0)
Neutro Abs: 3.5 10*3/uL (ref 1.4–7.7)
Neutrophils Relative %: 61.1 % (ref 43.0–77.0)
Platelets: 291 10*3/uL (ref 150.0–400.0)
RBC: 4.89 Mil/uL (ref 4.22–5.81)
RDW: 13.8 % (ref 11.5–15.5)
WBC: 5.7 10*3/uL (ref 4.0–10.5)

## 2018-12-21 LAB — LIPID PANEL
Cholesterol: 232 mg/dL — ABNORMAL HIGH (ref 0–200)
HDL: 68.9 mg/dL (ref >39.00)
LDL Cholesterol: 144 mg/dL — ABNORMAL HIGH (ref 0–99)
NonHDL: 163.22
Total CHOL/HDL Ratio: 3
Triglycerides: 97 mg/dL (ref 0.0–149.0)
VLDL: 19.4 mg/dL (ref 0.0–40.0)

## 2018-12-21 LAB — TSH: TSH: 1.76 u[IU]/mL (ref 0.35–4.50)

## 2018-12-21 LAB — VITAMIN D 25 HYDROXY (VIT D DEFICIENCY, FRACTURES): VITD: 12.02 ng/mL — ABNORMAL LOW (ref 30.00–100.00)

## 2018-12-21 MED ORDER — CHOLECALCIFEROL 1.25 MG (50000 UT) PO CAPS: 50000.0000 [IU] | ORAL_CAPSULE | ORAL | 1 refills | Status: DC

## 2018-12-21 NOTE — Addendum Note (Signed)
Addended by: Leeanne Rio on: 12/21/2018 08:20 AM   Modules accepted: Orders

## 2018-12-24 LAB — MEASLES/MUMPS/RUBELLA IMMUNITY
Mumps IgG: <9 AU/mL — ABNORMAL LOW
Rubella: 0.92 index — ABNORMAL LOW
Rubeola IgG: <13.5 AU/mL — ABNORMAL LOW

## 2018-12-24 LAB — HEPATITIS B SURFACE ANTIBODY, QUANTITATIVE: Hepatitis B-Post: <5 m[IU]/mL — ABNORMAL LOW (ref >=10)

## 2019-01-01 ENCOUNTER — Ambulatory Visit: Payer: 59 | Admitting: Internal Medicine

## 2019-01-02 ENCOUNTER — Telehealth: Payer: Self-pay

## 2019-01-02 ENCOUNTER — Other Ambulatory Visit: Payer: Self-pay

## 2019-01-02 ENCOUNTER — Ambulatory Visit (INDEPENDENT_AMBULATORY_CARE_PROVIDER_SITE_OTHER): Payer: 59 | Admitting: Internal Medicine

## 2019-01-02 DIAGNOSIS — E785 Hyperlipidemia, unspecified: Secondary | ICD-10-CM | POA: Diagnosis not present

## 2019-01-02 DIAGNOSIS — Z0000 Encounter for general adult medical examination without abnormal findings: Secondary | ICD-10-CM

## 2019-01-02 DIAGNOSIS — E559 Vitamin D deficiency, unspecified: Secondary | ICD-10-CM | POA: Diagnosis not present

## 2019-01-02 MED ORDER — CHOLECALCIFEROL 1.25 MG (50000 UT) PO CAPS: 50000.0000 [IU] | ORAL_CAPSULE | ORAL | 1 refills | Status: DC

## 2019-01-02 NOTE — Progress Notes (Signed)
Virtual Visit via Video Note  I connected with Eric Cunningham   on 01/02/19 at  8:13 AM EDT by a video enabled telemedicine application and verified that I am speaking with the correct person using two identifiers.  Location patient: home Location provider:work  Persons participating in the virtual visit: patient, provider  I discussed the limitations of evaluation and management by telemedicine and the availability of in person appointments. The patient expressed understanding and agreed to proceed.   HPI: 1. Annual doing well no DVT he had stent removed of of left leg and doing well 2. DVT-takes aspirin 81 mg qd  3. HLD-reviewed labs 12/21/2018 rec healthy diet and exercise  4. Vit D def did not pick up vitamin D yet  5. rec consider MMR and hep B vaccines    ROS: See pertinent positives and negatives per HPI. General: wt increased  HEENT: no sore throat CV: no chest pain  Lungs: no sob  Ab: no ab pain  MSK: no DVT, no joint pain  Neuro: no h/a Psych: no anxiety/depression  Past Medical History:  Diagnosis Date  . Asthma   . Chicken pox   . DVT (deep venous thrombosis) ([place])   . History of blood transfusion   . 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 Hyperlipidemia     Past Surgical History:  Procedure Laterality Date  . IVC FILTER REMOVAL N/A 06/12/2018   Procedure: IVC FILTER REMOVAL;  Surgeon: Katha Cabal, MD;  Location: [facility];  Service: Cardiovascular;  Laterality: N/A;  . PERIPHERAL VASCULAR CATHETERIZATION Left 04/26/2016   Procedure: Lower Extremity Venography and thrombectomy;  Surgeon: Katha Cabal, MD;  Location: [facility];  Service: Cardiovascular;  Laterality: Left;  . removal of dvt Left     Family History  Problem Relation Age of Onset  . Clotting disorder Mother   . Cancer Paternal Uncle     SOCIAL HX: kids newborn born 02/2018   Current Outpatient  Medications:  .  aspirin EC 81 MG tablet, Take 81 mg by mouth daily., Disp: , Rfl:  .  Cholecalciferol 1.25 MG (50000 UT) capsule, Take 1 capsule (50,000 Units total) by mouth once a week., Disp: 13 capsule, Rfl: 1  EXAM:  VITALS per patient if applicable:  GENERAL: alert, oriented, appears well and in no acute distress  HEENT: atraumatic, conjunttiva clear, no obvious abnormalities on inspection of external nose and ears  NECK: normal movements of the head and neck  LUNGS: on inspection no signs of respiratory distress, breathing rate appears normal, no obvious gross SOB, gasping or wheezing  CV: no obvious cyanosis  MS: moves all visible extremities without noticeable abnormality  PSYCH/NEURO: pleasant and cooperative, no obvious depression or anxiety, speech and thought processing grossly intact  ASSESSMENT AND PLAN:  Discussed the following assessment and plan:  Annual physical exam Declines flu shot  Tdap utp  rec mmr and hep B status  rec healthy diet choices and get wt down  Declines STD check  rec stop vap THC Vitamin D deficiency - Plan: Cholecalciferol 1.25 MG (50000 UT) capsule  Hyperlipidemia, unspecified hyperlipidemia type -mailed cholesterol handout   Vit D def D3 50K weekly x 6 months then 2K to 5K qd     I discussed the assessment and treatment plan with the patient. The patient was provided an opportunity to ask questions and all were  answered. The patient agreed with the plan and demonstrated an understanding of the instructions.   The patient was advised to call back or seek an in-person evaluation if the symptoms worsen or if the condition fails to improve as anticipated.  Time spent 15 minutes  Delorise Jackson, MD

## 2019-01-02 NOTE — Telephone Encounter (Signed)
Dr Olivia Mackie would like to take the NO Show Fee from this patient on [id number].  He rescheduled and has appointment today. Please advise.

## 2019-01-02 NOTE — Patient Instructions (Addendum)
Vitamin D after 6 months take 2000 to 5000 IU daily over the counter    Hepatitis B Vaccine, Recombinant injection What is this medicine? HEPATITIS B VACCINE (hep uh TAHY tis B VAK seen) is a vaccine. It is used to prevent an infection with the hepatitis B virus. This medicine may be used for other purposes; ask your health care provider or pharmacist if you have questions. COMMON BRAND NAME(S): Engerix-B, Recombivax HB, HEPLISAV B ( new vaccine 2 doses)  What should I tell my health care provider before I take this medicine? They need to know if you have any of these conditions: -fever, infection -heart disease -hepatitis B infection -immune system problems -kidney disease -an unusual or allergic reaction to vaccines, yeast, other medicines, foods, dyes, or preservatives -pregnant or trying to get pregnant -breast-feeding How should I use this medicine? This vaccine is for injection into a muscle. It is given by a health care professional. A copy of Vaccine Information Statements will be given before each vaccination. Read this sheet carefully each time. The sheet may change frequently. Talk to your pediatrician regarding the use of this medicine in children. While this drug may be prescribed for children as young as newborn for selected conditions, precautions do apply. Overdosage: If you think you have taken too much of this medicine contact a poison control center or emergency room at once. NOTE: This medicine is only for you. Do not share this medicine with others. What if I miss a dose? It is important not to miss your dose. Call your doctor or health care professional if you are unable to keep an appointment. What may interact with this medicine? -medicines that suppress your immune function like adalimumab, anakinra, infliximab -medicines to treat cancer -steroid medicines like prednisone or cortisone This list may not describe all possible interactions. Give your health care  provider a list of all the medicines, herbs, non-prescription drugs, or dietary supplements you use. Also tell them if you smoke, drink alcohol, or use illegal drugs. Some items may interact with your medicine. What should I watch for while using this medicine? See your health care provider for all shots of this vaccine as directed. You must have 3 shots of this vaccine for protection from hepatitis B infection. Tell your doctor right away if you have any serious or unusual side effects after getting this vaccine. What side effects may I notice from receiving this medicine? Side effects that you should report to your doctor or health care professional as soon as possible: -allergic reactions like skin rash, itching or hives, swelling of the face, lips, or tongue -breathing problems -confused, irritated -fast, irregular heartbeat -flu-like syndrome -numb, tingling pain -seizures -unusually weak or tired Side effects that usually do not require medical attention (report to your doctor or health care professional if they continue or are bothersome): -diarrhea -fever -headache -loss of appetite -muscle pain -nausea -pain, redness, swelling, or irritation at site where injected -tiredness This list may not describe all possible side effects. Call your doctor for medical advice about side effects. You may report side effects to FDA at 1-800-FDA-1088. Where should I keep my medicine? This drug is given in a hospital or clinic and will not be stored at home. NOTE: This sheet is a summary. It may not cover all possible information. If you have questions about this medicine, talk to your doctor, pharmacist, or health care provider.  2019 Elsevier/Gold Standard (2013-11-11 13:26:01)  Measles/Mumps/Rubella Vaccines, MMR injection What is  this medicine? MEASLES VIRUS; MUMPS VIRUS; RUBELLA VIRUS VACCINE LIVE (MEE zuhlz VAHY ruhs; muhmps VAHY ruhs; roo bel uh VAHY ruhs vak SEEN [place] ) is used to  prevent an infection with measles (rubeola), mumps, and rubella ([place] measles) viruses. It is used to prevent infection in children over 84 months old, adults that have not been vaccinated and are not pregnant, and anyone traveling to countries where there are high rates of measles, mumps, or rubella. This medicine may be used for other purposes; ask your health care provider or pharmacist if you have questions. COMMON BRAND NAME(S): M-M-R II What should I tell my health care provider before I take this medicine? They need to know if you have any of these conditions: -bleeding disorder -cancer including leukemia or lymphoma -immune system problems -infection with fever -low levels of platelets in the blood -recent blood transfusion or immune globulin infusion -seizure disorder -taking medicines for immunosuppression -an unusual or allergic reaction to vaccines, eggs, neomycin, gelatin, other medicines, foods, dyes, or preservatives -pregnant or trying to get pregnant -breast-feeding How should I use this medicine? This vaccine is for injection under the skin. It is given by a health care professional. A copy of Vaccine Information Statements will be given before each vaccination. Read this sheet carefully each time. The sheet may change frequently. Talk to your pediatrician regarding the use of this medicine in children. While this drug may be prescribed for children as young as 35 months of age for selected conditions, precautions do apply. Overdosage: If you think you have taken too much of this medicine contact a poison control center or emergency room at once. NOTE: This medicine is only for you. Do not share this medicine with others. What if I miss a dose? Keep appointments for follow-up (booster) doses as directed. It is important not to miss your dose. Call your doctor or health care professional if you are unable to keep an appointment. What may interact with this medicine? Do not  take this medicine with any of the following medications: -adalimumab -anakinra -etanercept -infliximab -medicines that suppress your immune system -medicines to treat cancer This medicine may also interact with the following medications: -immune globulins -live virus vaccines This list may not describe all possible interactions. Give your health care provider a list of all the medicines, herbs, non-prescription drugs, or dietary supplements you use. Also tell them if you smoke, drink alcohol, or use illegal drugs. Some items may interact with your medicine. What should I watch for while using this medicine? Visit your doctor for check-ups as directed. Do not become pregnant for 3 months after receiving this vaccine. Women should inform their doctor if they wish to become pregnant or think they might be pregnant. There is a potential for serious side effects to an unborn child. Talk to your health care professional or pharmacist for more information. What side effects may I notice from receiving this medicine? Side effects that you should report to your doctor or health care professional as soon as possible: -allergic reactions like skin rash, itching or hives, swelling of the face, lips, or tongue -breathing problems -changes in hearing -changes in vision -difficulty walking -extreme changes in behavior -fast, irregular heartbeat -fever over 100 degrees F -pain, tingling, numbness in the hands or feet -seizures -unusual bleeding or bruising -unusually weak or tired Side effects that usually do not require medical attention (report to your doctor or health care professional if they continue or are bothersome): -aches or pains -  bruising, pain, swelling at site where injected -diarrhea -headache -low-grade fever of 100 degrees F or less -nausea, vomiting -runny nose, cough -sleepy -swollen glands This list may not describe all possible side effects. Call your doctor for medical  advice about side effects. You may report side effects to FDA at 1-800-FDA-1088. Where should I keep my medicine? This drug is given in a hospital or clinic and will not be stored at home. NOTE: This sheet is a summary. It may not cover all possible information. If you have questions about this medicine, talk to your doctor, pharmacist, or health care provider.  2019 Elsevier/Gold Standard (2013-08-09 11:04:43)  Vitamin D Deficiency Vitamin D deficiency is when your body does not have enough vitamin D. Vitamin D is important to your body for many reasons:  It helps the body to absorb two important minerals, called calcium and phosphorus.  It plays a role in bone health.  It may help to prevent some diseases, such as diabetes and multiple sclerosis.  It plays a role in muscle function, including heart function. You can get vitamin D by:  Eating foods that naturally contain vitamin D.  Eating or drinking milk or other dairy products that have vitamin D added to them.  Taking a vitamin D supplement or a multivitamin supplement that contains vitamin D.  Being in the sun. Your body naturally makes vitamin D when your skin is exposed to sunlight. Your body changes the sunlight into a form of the vitamin that the body can use. If vitamin D deficiency is severe, it can cause a condition in which your bones become soft. In adults, this condition is called osteomalacia. In children, this condition is called rickets. What are the causes? Vitamin D deficiency may be caused by:  Not eating enough foods that contain vitamin D.  Not getting enough sun exposure.  Having certain digestive system diseases that make it difficult for your body to absorb vitamin D. These diseases include Crohn disease, chronic pancreatitis, and cystic fibrosis.  Having a surgery in which a part of the stomach or a part of the small intestine is removed.  Being obese.  Having chronic kidney disease or liver  disease. What increases the risk? This condition is more likely to develop in:  Older people.  People who do not spend much time outdoors.  People who live in a long-term care facility.  People who have had broken bones.  People with weak or thin bones (osteoporosis).  People who have a disease or condition that changes how the body absorbs vitamin D.  People who have dark skin.  People who take certain medicines, such as steroid medicines or certain seizure medicines.  People who are overweight or obese. What are the signs or symptoms? In mild cases of vitamin D deficiency, there may not be any symptoms. If the condition is severe, symptoms may include:  Bone pain.  Muscle pain.  Falling often.  Broken bones caused by a minor injury. How is this diagnosed? This condition is usually diagnosed with a blood test. How is this treated? Treatment for this condition may depend on what caused the condition. Treatment options include:  Taking vitamin D supplements.  Taking a calcium supplement. Your health care provider will suggest what dose is best for you. Follow these instructions at home:  Take medicines and supplements only as told by your health care provider.  Eat foods that contain vitamin D. Choices include: ? Fortified dairy products, cereals, or juices.  Fortified means that vitamin D has been added to the food. Check the label on the package to be sure. ? Fatty fish, such as salmon or trout. ? Eggs. ? Oysters.  Do not use a tanning bed.  Maintain a healthy weight. Lose weight, if needed.  Keep all follow-up visits as told by your health care provider. This is important. Contact a health care provider if:  Your symptoms do not go away.  You feel like throwing up (nausea) or you throw up (vomit).  You have fewer bowel movements than usual or it is difficult for you to have a bowel movement (constipation). This information is not intended to replace  advice given to you by your health care provider. Make sure you discuss any questions you have with your health care provider. Document Released: 10/03/2011 Document Revised: 12/23/2015 Document Reviewed: 11/26/2014 Elsevier Interactive Patient Education  2019 Elsevier Inc.  Cholesterol Cholesterol is a white, waxy, fat-like substance that is needed by the human body in small amounts. The liver makes all the cholesterol we need. Cholesterol is carried from the liver by the blood through the blood vessels. Deposits of cholesterol (plaques) may build up on blood vessel (artery) walls. Plaques make the arteries narrower and stiffer. Cholesterol plaques increase the risk for heart attack and stroke. You cannot feel your cholesterol level even if it is very high. The only way to know that it is high is to have a blood test. Once you know your cholesterol levels, you should keep a record of the test results. Work with your health care provider to keep your levels in the desired range. What do the results mean?  Total cholesterol is a rough measure of all the cholesterol in your blood.  LDL (low-density lipoprotein) is the bad cholesterol. This is the type that causes plaque to build up on the artery walls. You want this level to be low.  HDL (high-density lipoprotein) is the good cholesterol because it cleans the arteries and carries the LDL away. You want this level to be high.  Triglycerides are fat that the body can either burn for energy or store. High levels are closely linked to heart disease. What are the desired levels of cholesterol?  Total cholesterol below 200.  LDL below 100 for people who are at risk, below 70 for people at very high risk.  HDL above 40 is good. A level of 60 or higher is considered to be protective against heart disease.  Triglycerides below 150. How can I lower my cholesterol? Diet Follow your diet program as told by your health care provider.  Choose fish  or white meat chicken and [place], roasted or baked. Limit fatty cuts of red meat, fried foods, and processed meats, such as sausage and lunch meats.  Eat lots of fresh fruits and vegetables.  Choose whole grains, beans, pasta, potatoes, and cereals.  Choose olive oil, corn oil, or canola oil, and use only small amounts.  Avoid butter, mayonnaise, shortening, or palm kernel oils.  Avoid foods with trans fats.  Drink skim or nonfat milk and eat low-fat or nonfat yogurt and cheeses. Avoid whole milk, cream, ice cream, egg yolks, and full-fat cheeses.  Healthier desserts include angel food cake, ginger snaps, animal crackers, hard candy, popsicles, and low-fat or nonfat frozen yogurt. Avoid pastries, cakes, pies, and cookies.  Exercise  Follow your exercise program as told by your health care provider. A regular program: ? Helps to decrease LDL and raise HDL. ?  Helps with weight control.  Do things that increase your activity level, such as gardening, walking, and taking the stairs.  Ask your health care provider about ways that you can be more active in your daily life. Medicine  Take over-the-counter and prescription medicines only as told by your health care provider. ? Medicine may be prescribed by your health care provider to help lower cholesterol and decrease the risk for heart disease. This is usually done if diet and exercise have failed to bring down cholesterol levels. ? If you have several risk factors, you may need medicine even if your levels are normal. This information is not intended to replace advice given to you by your health care provider. Make sure you discuss any questions you have with your health care provider. Document Released: 04/05/2001 Document Revised: 02/06/2016 Document Reviewed: 01/09/2016 Elsevier Interactive Patient Education  [employer].

## 2019-01-02 NOTE — Telephone Encounter (Signed)
The patient appointment has been rescheduled.

## 2019-01-11 NOTE — Progress Notes (Signed)
I called pt to schedule annual in 6-12 month.

## 2019-02-07 DIAGNOSIS — H524 Presbyopia: Secondary | ICD-10-CM | POA: Diagnosis not present

## 2019-03-05 ENCOUNTER — Other Ambulatory Visit: Payer: Self-pay

## 2019-03-07 ENCOUNTER — Encounter: Payer: Self-pay | Admitting: Internal Medicine

## 2019-03-07 ENCOUNTER — Ambulatory Visit: Payer: 59 | Admitting: Internal Medicine

## 2019-03-07 ENCOUNTER — Telehealth: Payer: Self-pay | Admitting: Internal Medicine

## 2019-03-07 ENCOUNTER — Other Ambulatory Visit: Payer: Self-pay

## 2019-03-07 DIAGNOSIS — D6859 Other primary thrombophilia: Secondary | ICD-10-CM | POA: Insufficient documentation

## 2019-03-07 NOTE — Telephone Encounter (Signed)
Schedule annual 01/02/2020

## 2019-03-07 NOTE — Progress Notes (Signed)
Entered in error  [place]

## 2019-10-17 ENCOUNTER — Ambulatory Visit: Payer: 59 | Attending: Internal Medicine

## 2019-10-17 DIAGNOSIS — Z23 Encounter for immunization: Secondary | ICD-10-CM

## 2019-10-17 NOTE — Progress Notes (Signed)
   Covid-19 Vaccination Clinic  Name:  Eric Cunningham    MRN: [id number] DOB: August 05, 1975  10/17/2019  [name] was observed post Covid-19 immunization for 15 minutes without incident. He was provided with Vaccine Information Sheet and instruction to access the V-Safe system.   [name] was instructed to call 911 with any severe reactions post vaccine: Marland Kitchen Difficulty breathing  . Swelling of face and throat  . A fast heartbeat  . A bad rash all over body  . Dizziness and weakness   Immunizations Administered    Name Date Dose VIS Date Route   Moderna COVID-19 Vaccine 10/17/2019  9:10 AM 0.5 mL 06/25/2019 Intramuscular   Manufacturer: Moderna   Lot: [id number]   [id number]

## 2019-11-19 ENCOUNTER — Ambulatory Visit: Payer: 59 | Attending: Internal Medicine

## 2019-11-19 DIAGNOSIS — Z23 Encounter for immunization: Secondary | ICD-10-CM

## 2019-11-19 NOTE — Progress Notes (Signed)
   Covid-19 Vaccination Clinic  Name:  Eric Cunningham    MRN: [id number] DOB: 02/21/1976  11/19/2019  [name] was observed post Covid-19 immunization for 15 minutes without incident. He was provided with Vaccine Information Sheet and instruction to access the V-Safe system.   [name] was instructed to call 911 with any severe reactions post vaccine: Marland Kitchen Difficulty breathing  . Swelling of face and throat  . A fast heartbeat  . A bad rash all over body  . Dizziness and weakness   Immunizations Administered    Name Date Dose VIS Date Route   Moderna COVID-19 Vaccine 11/19/2019  9:38 AM 0.5 mL 06/2019 Intramuscular   Manufacturer: Moderna   Lot: [id number]   [id number]

## 2020-01-02 ENCOUNTER — Encounter: Payer: 59 | Admitting: Internal Medicine

## 2020-04-02 ENCOUNTER — Encounter: Payer: 59 | Admitting: Internal Medicine

## 2020-04-28 ENCOUNTER — Encounter: Payer: Self-pay | Admitting: Internal Medicine

## 2020-05-26 ENCOUNTER — Other Ambulatory Visit: Payer: Self-pay

## 2020-05-26 ENCOUNTER — Ambulatory Visit (INDEPENDENT_AMBULATORY_CARE_PROVIDER_SITE_OTHER): Payer: No Typology Code available for payment source | Admitting: Internal Medicine

## 2020-05-26 ENCOUNTER — Encounter: Payer: Self-pay | Admitting: Internal Medicine

## 2020-05-26 VITALS — BP 126/80 | HR 84 | Temp 98.2°F | Ht 65.95 in | Wt 196.4 lb

## 2020-05-26 DIAGNOSIS — E559 Vitamin D deficiency, unspecified: Secondary | ICD-10-CM | POA: Diagnosis not present

## 2020-05-26 DIAGNOSIS — Z1389 Encounter for screening for other disorder: Secondary | ICD-10-CM

## 2020-05-26 DIAGNOSIS — Z1211 Encounter for screening for malignant neoplasm of colon: Secondary | ICD-10-CM

## 2020-05-26 DIAGNOSIS — E785 Hyperlipidemia, unspecified: Secondary | ICD-10-CM | POA: Diagnosis not present

## 2020-05-26 DIAGNOSIS — Z0000 Encounter for general adult medical examination without abnormal findings: Secondary | ICD-10-CM

## 2020-05-26 DIAGNOSIS — F320 Major depressive disorder, single episode, mild: Secondary | ICD-10-CM

## 2020-05-26 DIAGNOSIS — R590 Localized enlarged lymph nodes: Secondary | ICD-10-CM

## 2020-05-26 NOTE — Progress Notes (Signed)
Chief Complaint  Patient presents with  . Annual Exam   Annual  1. Feeling well  2. HLD changed diet needs to exercise  3. Thinking about vasectomy will call back for referral [facility]  4. Left below jawline gland swelling x 1-2 months notices when smokes or has canker sore or h/o wisdom teeth removal and hole in the area and he has to get dental cleanings Q4 months and upcoming appt dental 07/2020  Declines US neck for now   Review of Systems  Constitutional: Negative for weight loss.  HENT: Negative for hearing loss.   Eyes: Negative for blurred vision.  Respiratory: Negative for shortness of breath.   Cardiovascular: Negative for chest pain.  Gastrointestinal: Negative for abdominal pain.  Musculoskeletal: Negative for falls.  Skin: Negative for rash.  Neurological: Negative for headaches.  Psychiatric/Behavioral: Negative for depression.   Past Medical History:  Diagnosis Date  . Asthma   . Chicken pox   . DVT (deep venous thrombosis) ([place])   . History of blood transfusion   . 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 Hyperlipidemia    Past Surgical History:  Procedure Laterality Date  . IVC FILTER REMOVAL N/A 06/12/2018   Procedure: IVC FILTER REMOVAL;  Surgeon: Katha Cabal, MD;  Location: [facility];  Service: Cardiovascular;  Laterality: N/A;  . PERIPHERAL VASCULAR CATHETERIZATION Left 04/26/2016   Procedure: Lower Extremity Venography and thrombectomy;  Surgeon: Katha Cabal, MD;  Location: [facility];  Service: Cardiovascular;  Laterality: Left;  . removal of dvt Left    Family History  Problem Relation Age of Onset  . Clotting disorder Mother   . Cancer Paternal Uncle        colon   . Cancer Other        p. cousin colon cancer   Social History   Socioeconomic History  . Marital status: Single    Spouse name: Not on file  . Number of children: Not on file  . Years  of education: Not on file  . Highest education level: Not on file  Occupational History  . Not on file  Tobacco Use  . Smoking status: Former Smoker    Types: Cigars    Quit date: 06/12/2017    Years since quitting: 2.9  . Smokeless tobacco: Never Used  Vaping Use  . Vaping Use: Never used  Substance and Sexual Activity  . Alcohol use: Yes    Alcohol/week: 6.0 standard drinks    Types: 6 Cans of beer per week    Comment: occasional, once a week  . Drug use: Yes    Types: Marijuana  . Sexual activity: Yes  Other Topics Concern  . Not on file  Social History Narrative   Daughter and step child    Married wife expecting 2nd child together 03/2018 total 3 kids    Used to be Environmental education officer    Social Determinants of Radio broadcast assistant Strain:   . Difficulty of Paying Living Expenses: Not on file  Food Insecurity:   . Worried About Charity fundraiser in the Last Year: Not on file  . Ran Out of Food in the Last Year: Not on file  Transportation Needs:   . Lack of Transportation (Medical): Not on file  . Lack of Transportation (Non-Medical): Not on file  Physical Activity:   . Days  of Exercise per Week: Not on file  . Minutes of Exercise per Session: Not on file  Stress:   . Feeling of Stress : Not on file  Social Connections:   . Frequency of Communication with Friends and Family: Not on file  . Frequency of Social Gatherings with Friends and Family: Not on file  . Attends Religious Services: Not on file  . Active Member of Clubs or Organizations: Not on file  . Attends Archivist Meetings: Not on file  . Marital Status: Not on file  Intimate Partner Violence:   . Fear of Current or Ex-Partner: Not on file  . Emotionally Abused: Not on file  . Physically Abused: Not on file  . Sexually Abused: Not on file   No outpatient medications have been marked as taking for the 05/26/20 encounter (Office Visit) with McLean-Scocuzza, Nino Glow, MD.   Allergies   Allergen Reactions  . Ativan [Lorazepam] Other (See Comments)    Patient states he was very anxious and very hyper.  . Penicillins Other (See Comments)    Patient states his mother said he was allergic at birth. Unknown reation Has patient had a PCN reaction causing immediate rash, facial/tongue/throat swelling, SOB or lightheadedness with hypotension: Unknown Has patient had a PCN reaction causing severe rash involving mucus membranes or skin necrosis: Unknown Has patient had a PCN reaction that required hospitalization: Unknown Has patient had a PCN reaction occurring within the last 10 years: No If all of the above answers are "NO", then may proce   No results found for this or any previous visit (from the past 2160 hour(s)). Objective  Body mass index is 31.75 kg/m. Wt Readings from Last 3 Encounters:  05/26/20 196 lb 6.4 oz (89.1 kg)  06/12/18 190 lb (86.2 kg)  05/31/18 196 lb (88.9 kg)   Temp Readings from Last 3 Encounters:  05/26/20 98.2 F (36.8 C) (Oral)  06/12/18 98.4 F (36.9 C) (Oral)  02/21/18 98.5 F (36.9 C) (Oral)   BP Readings from Last 3 Encounters:  05/26/20 126/80  06/12/18 117/73  05/31/18 124/81   Pulse Readings from Last 3 Encounters:  05/26/20 84  06/12/18 70  05/31/18 72    Physical Exam Vitals and nursing note reviewed.  Constitutional:      Appearance: Normal appearance. He is well-developed and well-groomed. He is obese.  HENT:     Head: Normocephalic and atraumatic.  Eyes:     Conjunctiva/sclera: Conjunctivae normal.     Pupils: Pupils are equal, round, and reactive to light.  Cardiovascular:     Rate and Rhythm: Normal rate and regular rhythm.     Heart sounds: Normal heart sounds. No murmur heard.   Pulmonary:     Effort: Pulmonary effort is normal.     Breath sounds: Normal breath sounds.  Abdominal:     General: Abdomen is flat. Bowel sounds are normal.     Tenderness: There is no abdominal tenderness.  Skin:    General:  Skin is warm and dry.  Neurological:     General: No focal deficit present.     Mental Status: He is alert and oriented to person, place, and time. Mental status is at baseline.     Gait: Gait normal.  Psychiatric:        Attention and Perception: Attention and perception normal.        Mood and Affect: Mood and affect normal.        Speech: Speech normal.  Behavior: Behavior normal. Behavior is cooperative.        Thought Content: Thought content normal.        Cognition and Memory: Cognition and memory normal.        Judgment: Judgment normal.     Assessment  Plan  Annual physical exam - Plan: Comprehensive metabolic panel, Lipid panel, CBC with Differential/Platelet, TSH, Urinalysis, Routine w reflex microscopic, Declines flu shot Tdap utp  rec mmr and hep B status  moderna booster disc booster will sch today  rec healthy diet choices and get wt down  Declines STD check  rec stop vap THC rec f/u dental   Cervical lymphadenopathy, left could be dental related  Declines US neck for now  rec f/u dental  Hyperlipidemia, unspecified hyperlipidemia type - Plan: Lipid panel  Info rec healthy diet and exercise   Vitamin D deficiency - check vitamin D today   Call back US neck lymphadenopathy and [facility] vasectomy      Provider: Dr. Olivia Mackie McLean-Scocuzza-Internal Medicine

## 2020-05-26 NOTE — Addendum Note (Signed)
Addended by: Tor Netters I on: 05/26/2020 04:10 PM   Modules accepted: Orders

## 2020-05-26 NOTE — Patient Instructions (Addendum)
Consider MMR vaccine x 2 doses the pharmacy   New hepatitis B vaccine x 2 doses (our clinic)   Ref. Range 12/21/2018 08:01  Rubella Latest Units: index 0.92 (L)  Hepatitis B-Post Latest Ref Range: > OR = 10 mIU/mL <5 (L)  Mumps IgG Latest Units: AU/mL <9.00 (L)  Rubeola IgG Latest Units: AU/mL <13.50 (L)    Consider ultrasound of your neck if continues call back w/in the next month  [facility] for v[facility]    High Cholesterol  High cholesterol is a condition in which the blood has high levels of a white, waxy, fat-like substance (cholesterol). The human body needs small amounts of cholesterol. The liver makes all the cholesterol that the body needs. Extra (excess) cholesterol comes from the food that we eat. Cholesterol is carried from the liver by the blood through the blood vessels. If you have high cholesterol, deposits (plaques) may build up on the walls of your blood vessels (arteries). Plaques make the arteries narrower and stiffer. Cholesterol plaques increase your risk for heart attack and stroke. Work with your health care provider to keep your cholesterol levels in a healthy range. What increases the risk? This condition is more likely to develop in people who:  Eat foods that are high in animal fat (saturated fat) or cholesterol.  Are overweight.  Are not getting enough exercise.  Have a family history of high cholesterol. What are the signs or symptoms? There are no symptoms of this condition. How is this diagnosed? This condition may be diagnosed from the results of a blood test.  If you are older than age 18, your health care provider may check your cholesterol every 4-6 years.  You may be checked more often if you already have high cholesterol or other risk factors for heart disease. The blood test for cholesterol measures:  "Bad" cholesterol (LDL cholesterol). This is the main type of cholesterol that causes heart disease. The desired level for LDL is less  than 100.  "Good" cholesterol (HDL cholesterol). This type helps to protect against heart disease by cleaning the arteries and carrying the LDL away. The desired level for HDL is 60 or higher.  Triglycerides. These are fats that the body can store or burn for energy. The desired number for triglycerides is lower than 150.  Total cholesterol. This is a measure of the total amount of cholesterol in your blood, including LDL cholesterol, HDL cholesterol, and triglycerides. A healthy number is less than 200. How is this treated? This condition is treated with diet changes, lifestyle changes, and medicines. Diet changes  This may include eating more whole grains, fruits, vegetables, nuts, and fish.  This may also include cutting back on red meat and foods that have a lot of added sugar. Lifestyle changes  Changes may include getting at least 40 minutes of aerobic exercise 3 times a week. Aerobic exercises include walking, biking, and swimming. Aerobic exercise along with a healthy diet can help you maintain a healthy weight.  Changes may also include quitting smoking. Medicines  Medicines are usually given if diet and lifestyle changes have failed to reduce your cholesterol to healthy levels.  Your health care provider may prescribe a statin medicine. Statin medicines have been shown to reduce cholesterol, which can reduce the risk of heart disease. Follow these instructions at home: Eating and drinking If told by your health care provider:  Eat chicken (without skin), fish, veal, shellfish, ground [place] breast, and round or loin cuts of red  meat.  Do not eat fried foods or fatty meats, such as hot dogs and salami.  Eat plenty of fruits, such as apples.  Eat plenty of vegetables, such as broccoli, potatoes, and carrots.  Eat beans, peas, and lentils.  Eat grains such as barley, rice, couscous, and bulgur wheat.  Eat pasta without cream sauces.  Use skim or nonfat milk, and eat  low-fat or nonfat yogurt and cheeses.  Do not eat or drink whole milk, cream, ice cream, egg yolks, or hard cheeses.  Do not eat stick margarine or tub margarines that contain trans fats (also called partially hydrogenated oils).  Do not eat saturated tropical oils, such as coconut oil and palm oil.  Do not eat cakes, cookies, crackers, or other baked goods that contain trans fats.  General instructions  Exercise as directed by your health care provider. Increase your activity level with activities such as gardening, walking, and taking the stairs.  Take over-the-counter and prescription medicines only as told by your health care provider.  Do not use any products that contain nicotine or tobacco, such as cigarettes and e-cigarettes. If you need help quitting, ask your health care provider.  Keep all follow-up visits as told by your health care provider. This is important. Contact a health care provider if:  You are struggling to maintain a healthy diet or weight.  You need help to start on an exercise program.  You need help to stop smoking. Get help right away if:  You have chest pain.  You have trouble breathing. This information is not intended to replace advice given to you by your health care provider. Make sure you discuss any questions you have with your health care provider. Document Revised: 07/14/2017 Document Reviewed: 01/09/2016 Elsevier Patient Education  [place].  Cholesterol Content in Foods Cholesterol is a waxy, fat-like substance that helps to carry fat in the blood. The body needs cholesterol in small amounts, but too much cholesterol can cause damage to the arteries and heart. Most people should eat less than 200 milligrams (mg) of cholesterol a day. Foods with cholesterol  Cholesterol is found in animal-based foods, such as meat, seafood, and dairy. Generally, low-fat dairy and lean meats have less cholesterol than full-fat dairy and fatty  meats. The milligrams of cholesterol per serving (mg per serving) of common cholesterol-containing foods are listed below. Meat and other proteins  Egg -- one large whole egg has 186 mg.  Veal shank -- 4 oz has 141 mg.  Lean ground [place] (93% lean) -- 4 oz has 118 mg.  Fat-trimmed lamb loin -- 4 oz has 106 mg.  Lean ground beef (90% lean) -- 4 oz has 100 mg.  Lobster -- 3.5 oz has 90 mg.  Pork loin chops -- 4 oz has 86 mg.  Canned salmon -- 3.5 oz has 83 mg.  Fat-trimmed beef top loin -- 4 oz has 78 mg.  Frankfurter -- 1 frank (3.5 oz) has 77 mg.  Crab -- 3.5 oz has 71 mg.  Roasted chicken without skin, white meat -- 4 oz has 66 mg.  Light bologna -- 2 oz has 45 mg.  Deli-cut [place] -- 2 oz has 31 mg.  Canned tuna -- 3.5 oz has 31 mg.  Berniece Salines -- 1 oz has 29 mg.  Oysters and mussels (raw) -- 3.5 oz has 25 mg.  Mackerel -- 1 oz has 22 mg.  Trout -- 1 oz has 20 mg.  Pork sausage -- 1  link (1 oz) has 17 mg.  Salmon -- 1 oz has 16 mg.  Tilapia -- 1 oz has 14 mg. Dairy  Soft-serve ice cream --  cup (4 oz) has 103 mg.  Whole-milk yogurt -- 1 cup (8 oz) has 29 mg.  Cheddar cheese -- 1 oz has 28 mg.  American cheese -- 1 oz has 28 mg.  Whole milk -- 1 cup (8 oz) has 23 mg.  2% milk -- 1 cup (8 oz) has 18 mg.  Cream cheese -- 1 tablespoon (Tbsp) has 15 mg.  Cottage cheese --  cup (4 oz) has 14 mg.  Low-fat (1%) milk -- 1 cup (8 oz) has 10 mg.  Sour cream -- 1 Tbsp has 8.5 mg.  Low-fat yogurt -- 1 cup (8 oz) has 8 mg.  Nonfat Greek yogurt -- 1 cup (8 oz) has 7 mg.  Half-and-half cream -- 1 Tbsp has 5 mg. Fats and oils  Cod liver oil -- 1 tablespoon (Tbsp) has 82 mg.  Butter -- 1 Tbsp has 15 mg.  Lard -- 1 Tbsp has 14 mg.  Bacon grease -- 1 Tbsp has 14 mg.  Mayonnaise -- 1 Tbsp has 5-10 mg.  Margarine -- 1 Tbsp has 3-10 mg. Exact amounts of cholesterol in these foods may vary depending on specific ingredients and brands. Foods without  cholesterol Most plant-based foods do not have cholesterol unless you combine them with a food that has cholesterol. Foods without cholesterol include:  Grains and cereals.  Vegetables.  Fruits.  Vegetable oils, such as olive, canola, and sunflower oil.  Legumes, such as peas, beans, and lentils.  Nuts and seeds.  Egg whites. Summary  The body needs cholesterol in small amounts, but too much cholesterol can cause damage to the arteries and heart.  Most people should eat less than 200 milligrams (mg) of cholesterol a day. This information is not intended to replace advice given to you by your health care provider. Make sure you discuss any questions you have with your health care provider. Document Revised: 06/23/2017 Document Reviewed: 03/07/2017 Elsevier Patient Education  [place].  Vasectomy Vasectomy is a procedure in which the tube that carries sperm from the testicle to the urethra (vas deferens) is tied. It may also be cut. The procedure blocks sperm from going through the vas deferens and penis during ejaculation. This ensures that sperm does not go into the vagina during sex. Vasectomy does not affect your sexual desire or performance, and does not prevent sexually transmitted diseases. Vasectomy is considered a permanent and very effective form of birth control (contraception). The decision to have a vasectomy should not be made during a stressful situation, such as after the loss of a pregnancy or a divorce. You and your partner should make the decision to have a vasectomy when you are sure that you do not want children in the future. Tell a health care provider about:  Any allergies you have.  All medicines you are taking, including vitamins, herbs, eye drops, creams, and over-the-counter medicines.  Any problems you or family members have had with anesthetic medicines.  Any blood disorders you have.  Any surgeries you have had.  Any medical conditions  you have. What are the risks? Generally, this is a safe procedure. However, problems may occur, including:  Infection.  Bleeding and swelling of the scrotum.  Allergic reactions to medicines.  Failure of the procedure to prevent pregnancy. There is a very small chance that the  cut ends of the vas deferens may reconnect (recanalization), meaning that you could still make a woman pregnant.  Pain in the scrotum that continues after healing from the procedure. What happens before the procedure?  Ask your health care provider about: ? Changing or stopping your regular medicines. This is especially important if you are taking diabetes medicines or blood thinners. ? Taking over-the-counter medicines, vitamins, herbs, and supplements. ? Taking medicines such as aspirin and ibuprofen. These medicines can thin your blood. Do not take these medicines unless your health care provider tells you to take them.  You may be asked to shower with a germ-killing soap.  Plan to have someone take you home from the hospital or clinic. What happens during the procedure?   To lower your risk of infection: ? Your health care team will wash or sanitize their hands. ? Hair may be removed from the surgical area. ? Your scrotum will be washed with soap.  You will be given one or more of the following: ? A medicine to help you relax (sedative). You may be instructed to take this a few hours before the procedure. ? A medicine to numb the area (local anesthetic).  Your health care provider will feel (palpate) for your vas deferens.  To reach the vas deferens, one of two methods may be used: ? A very small incision may be made in your scrotum. ? A punctured opening may be made in your scrotum, without an incision.  Your vas deferens will be pulled out of your scrotum, and may be: ? Tied off. ? Cut and possibly burned (cauterized) at the ends to seal them off.  The vas deferens will be put back into your  scrotum.  The incision or puncture opening will be closed with absorbable stitches (sutures). The sutures will eventually dissolve and will not need to be removed after the procedure. The procedure may vary among health care providers and hospitals. What happens after the procedure?  You will be monitored to make sure that you do not experience problems.  You will be asked not to ejaculate for at least 1 week after the procedure, or as long as directed.  You will need to use a different form of contraception for 2-4 months after the procedure, until you have test results confirming that there are no sperm in your semen.  You may be given scrotal support to wear, such as a jock strap or underwear with a supportive pouch.  Do not drive for 24 hours if you were given a sedative to help you relax. Summary  Vasectomy is considered a permanent and very effective form of birth control (contraception). The procedure prevents sperm from being released during ejaculation.  Your scrotum will be numbed with medicine (local anesthetic) for the procedure.  After the procedure, you will be asked not to ejaculate for at least 1 week, or for as long as directed. You will also need to use a different form of contraception until your health care provider examines you and finds that there are no sperm in your semen. This information is not intended to replace advice given to you by your health care provider. Make sure you discuss any questions you have with your health care provider. Document Revised: 01/12/2018 Document Reviewed: 10/07/2016 Elsevier Patient Education  [place].   Lymphadenopathy  Lymphadenopathy means that your lymph glands are swollen or larger than normal (enlarged). Lymph glands, also called lymph nodes, are collections of tissue that filter  bacteria, viruses, and waste from your bloodstream. They are part of your body's disease-fighting system (immune system), which protects  your body from germs. There may be different causes of lymphadenopathy, depending on where it is in your body. Some types go away on their own. Lymphadenopathy can occur anywhere that you have lymph glands, including these areas:  Neck (cervical lymphadenopathy).  Chest (mediastinal lymphadenopathy).  Lungs (hilar lymphadenopathy).  Underarms (axillary lymphadenopathy).  Groin (inguinal lymphadenopathy). When your immune system responds to germs, infection-fighting cells and fluid build up in your lymph glands. This causes some swelling and enlargement. If the lymph glands do not go back to normal after you have an infection or disease, your health care provider may do tests. These tests help to monitor your condition and find the reason why the glands are still swollen and enlarged. Follow these instructions at home:  Get plenty of rest.  Take over-the-counter and prescription medicines only as told by your health care provider. Your health care provider may recommend over-the-counter medicines for pain.  If directed, apply heat to swollen lymph glands as often as told by your health care provider. Use the heat source that your health care provider recommends, such as a moist heat pack or a heating pad. ? Place a towel between your skin and the heat source. ? Leave the heat on for 20-30 minutes. ? Remove the heat if your skin turns bright red. This is especially important if you are unable to feel pain, heat, or cold. You may have a greater risk of getting burned.  Check your affected lymph glands every day for changes. Check other lymph gland areas as told by your health care provider. Check for changes such as: ? More swelling. ? Sudden increase in size. ? Redness or pain. ? Hardness.  Keep all follow-up visits as told by your health care provider. This is important. Contact a health care provider if you have:  Swelling that gets worse or spreads to other areas.  Problems with  breathing.  Lymph glands that: ? Are still swollen after 2 weeks. ? Have suddenly gotten bigger. ? Are red, painful, or hard.  A fever or chills.  Fatigue.  A sore throat.  Pain in your abdomen.  Weight loss.  Night sweats. Get help right away if you have:  Fluid leaking from an enlarged lymph gland.  Severe pain.  Chest pain.  Shortness of breath. Summary  Lymphadenopathy means that your lymph glands are swollen or larger than normal (enlarged).  Lymph glands (also called lymph nodes) are collections of tissue that filter bacteria, viruses, and waste from the bloodstream. They are part of your body's disease-fighting system (immune system).  Lymphadenopathy can occur anywhere that you have lymph glands.  If your enlarged and swollen lymph glands do not go back to normal after you have an infection or disease, your health care provider may do tests to monitor your condition and find the reason why the glands are still swollen and enlarged.  Check your affected lymph glands every day for changes. Check other lymph gland areas as told by your health care provider. This information is not intended to replace advice given to you by your health care provider. Make sure you discuss any questions you have with your health care provider. Document Revised: 06/23/2017 Document Reviewed: 05/26/2017 Elsevier Patient Education  2020 [place].   Vitamin D Deficiency Vitamin D deficiency is when your body does not have enough vitamin D. Vitamin D is  important to your body for many reasons:  It helps the body absorb two important minerals--calcium and phosphorus.  It plays a role in bone health.  It may help to prevent some diseases, such as diabetes and multiple sclerosis.  It plays a role in muscle function, including heart function. If vitamin D deficiency is severe, it can cause a condition in which your bones become soft. In adults, this condition is called  osteomalacia. In children, this condition is called rickets. What are the causes? This condition may be caused by:  Not eating enough foods that contain vitamin D.  Not getting enough natural sun exposure.  Having certain digestive system diseases that make it difficult for your body to absorb vitamin D. These diseases include Crohn's disease, chronic pancreatitis, and cystic fibrosis.  Having a surgery in which a part of the stomach or a part of the small intestine is removed.  Having chronic kidney disease or liver disease. What increases the risk? You are more likely to develop this condition if you:  Are older.  Do not spend much time outdoors.  Live in a long-term care facility.  Have had broken bones.  Have weak or thin bones (osteoporosis).  Have a disease or condition that changes how the body absorbs vitamin D.  Have dark skin.  Take certain medicines, such as steroid medicines or certain seizure medicines.  Are overweight or obese. What are the signs or symptoms? In mild cases of vitamin D deficiency, there may not be any symptoms. If the condition is severe, symptoms may include:  Bone pain.  Muscle pain.  Falling often.  Broken bones caused by a minor injury. How is this diagnosed? This condition may be diagnosed with blood tests. Imaging tests such as X-rays may also be done to look for changes in the bone. How is this treated? Treatment for this condition may depend on what caused the condition. Treatment options include:  Taking vitamin D supplements. Your health care provider will suggest what dose is best for you.  Taking a calcium supplement. Your health care provider will suggest what dose is best for you. Follow these instructions at home: Eating and drinking   Eat foods that contain vitamin D. Choices include: ? Fortified dairy products, cereals, or juices. Fortified means that vitamin D has been added to the food. Check the label on the  package to see if the food is fortified. ? Fatty fish, such as salmon or trout. ? Eggs. ? Oysters. ? Mushrooms. The items listed above may not be a complete list of recommended foods and beverages. Contact a dietitian for more information. General instructions  Take medicines and supplements only as told by your health care provider.  Get regular, safe exposure to natural sunlight.  Do not use a tanning bed.  Maintain a healthy weight. Lose weight if needed.  Keep all follow-up visits as told by your health care provider. This is important. How is this prevented? You can get vitamin D by:  Eating foods that naturally contain vitamin D.  Eating or drinking products that have been fortified with vitamin D, such as cereals, juices, and dairy products (including milk).  Taking a vitamin D supplement or a multivitamin supplement that contains vitamin D.  Being in the sun. Your body naturally makes vitamin D when your skin is exposed to sunlight. Your body changes the sunlight into a form of the vitamin that it can use. Contact a health care provider if:  Your symptoms  do not go away.  You feel nauseous or you vomit.  You have fewer bowel movements than usual or are constipated. Summary  Vitamin D deficiency is when your body does not have enough vitamin D.  Vitamin D is important to your body for good bone health and muscle function, and it may help prevent some diseases.  Vitamin D deficiency is primarily treated through supplementation. Your health care provider will suggest what dose is best for you.  You can get vitamin D by eating foods that contain vitamin D, by being in the sun, and by taking a vitamin D supplement or a multivitamin supplement that contains vitamin D. This information is not intended to replace advice given to you by your health care provider. Make sure you discuss any questions you have with your health care provider. Document Revised: 03/19/2018  Document Reviewed: 03/19/2018 Elsevier Patient Education  [place].

## 2020-05-27 LAB — VITAMIN D 25 HYDROXY (VIT D DEFICIENCY, FRACTURES): VITD: 22.91 ng/mL — ABNORMAL LOW (ref 30.00–100.00)

## 2020-05-27 LAB — CBC WITH DIFFERENTIAL/PLATELET
Basophils Absolute: 0 K/uL (ref 0.0–0.1)
Basophils Relative: 0.2 % (ref 0.0–3.0)
Eosinophils Absolute: 0.1 K/uL (ref 0.0–0.7)
Eosinophils Relative: 0.6 % (ref 0.0–5.0)
HCT: 45.9 % (ref 39.0–52.0)
Hemoglobin: 15.4 g/dL (ref 13.0–17.0)
Lymphocytes Relative: 20.8 % (ref 12.0–46.0)
Lymphs Abs: 1.9 K/uL (ref 0.7–4.0)
MCHC: 33.6 g/dL (ref 30.0–36.0)
MCV: 89.1 fl (ref 78.0–100.0)
Monocytes Absolute: 0.9 K/uL (ref 0.1–1.0)
Monocytes Relative: 9.6 % (ref 3.0–12.0)
Neutro Abs: 6.3 K/uL (ref 1.4–7.7)
Neutrophils Relative %: 68.8 % (ref 43.0–77.0)
Platelets: 328 K/uL (ref 150.0–400.0)
RBC: 5.15 Mil/uL (ref 4.22–5.81)
RDW: 13.7 % (ref 11.5–15.5)
WBC: 9.2 K/uL (ref 4.0–10.5)

## 2020-05-27 LAB — LIPID PANEL
Cholesterol: 227 mg/dL — ABNORMAL HIGH (ref 0–200)
HDL: 71.6 mg/dL
LDL Cholesterol: 146 mg/dL — ABNORMAL HIGH (ref 0–99)
NonHDL: 155.58
Total CHOL/HDL Ratio: 3
Triglycerides: 50 mg/dL (ref 0.0–149.0)
VLDL: 10 mg/dL (ref 0.0–40.0)

## 2020-05-27 LAB — COMPREHENSIVE METABOLIC PANEL
ALT: 18 U/L (ref 0–53)
AST: 19 U/L (ref 0–37)
Albumin: 4.8 g/dL (ref 3.5–5.2)
Alkaline Phosphatase: 72 U/L (ref 39–117)
BUN: 14 mg/dL (ref 6–23)
CO2: 26 mEq/L (ref 19–32)
Calcium: 9.4 mg/dL (ref 8.4–10.5)
Chloride: 101 mEq/L (ref 96–112)
Creatinine, Ser: 0.76 mg/dL (ref 0.40–1.50)
GFR: 108.3 mL/min (ref >60.00)
Glucose, Bld: 80 mg/dL (ref 70–99)
Potassium: 4.2 mEq/L (ref 3.5–5.1)
Sodium: 136 mEq/L (ref 135–145)
Total Bilirubin: 0.6 mg/dL (ref 0.2–1.2)
Total Protein: 7.5 g/dL (ref 6.0–8.3)

## 2020-05-27 LAB — TSH: TSH: 1.35 u[IU]/mL (ref 0.35–4.50)

## 2020-08-19 ENCOUNTER — Other Ambulatory Visit: Payer: Self-pay | Admitting: Gastroenterology

## 2020-08-19 ENCOUNTER — Ambulatory Visit (INDEPENDENT_AMBULATORY_CARE_PROVIDER_SITE_OTHER): Payer: No Typology Code available for payment source | Admitting: Gastroenterology

## 2020-08-19 ENCOUNTER — Encounter: Payer: Self-pay | Admitting: Gastroenterology

## 2020-08-19 VITALS — BP 110/70 | HR 82 | Ht 66.0 in | Wt 198.0 lb

## 2020-08-19 DIAGNOSIS — Z1211 Encounter for screening for malignant neoplasm of colon: Secondary | ICD-10-CM | POA: Diagnosis not present

## 2020-08-19 MED ORDER — SUPREP BOWEL PREP KIT 17.5-3.13-1.6 GM/177ML PO SOLN: 1.0000 | ORAL | 0 refills | Status: DC

## 2020-08-19 NOTE — Progress Notes (Signed)
HPI: This is a very pleasant 45 year old man who was referred to me by McLean-Scocuzza, Olivia Mackie *  to discuss colon cancer screening  He believes his paternal uncle had colon cancer in his 3s or 28s.  He has never had colon cancer screening himself.  He has no issues with his bowels but does periodically have some perianal itching.  Sometimes he will see a bit of bright red blood on the tissue paper.  Overall his weight has been stable for many years.  He has no significant abdominal pains.  He had a DVT 13 years ago and again 4 years ago.  Sounds like he met with a hematologist and they did not recommend that he start a blood thinner unless he had a third deep vein thrombosis.  Old Data Reviewed: Blood work November 2021 CBC was normal, complete metabolic profile was normal   Review of systems: Pertinent positive and negative review of systems were noted in the above HPI section. All other review negative.   Past Medical History:  Diagnosis Date  . Asthma   . Chicken pox   . DVT (deep venous thrombosis) ([place])   . History of blood transfusion   . 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 Hyperlipidemia     Past Surgical History:  Procedure Laterality Date  . IVC FILTER REMOVAL N/A 06/12/2018   Procedure: IVC FILTER REMOVAL;  Surgeon: Katha Cabal, MD;  Location: [facility];  Service: Cardiovascular;  Laterality: N/A;  . PERIPHERAL VASCULAR CATHETERIZATION Left 04/26/2016   Procedure: Lower Extremity Venography and thrombectomy;  Surgeon: Katha Cabal, MD;  Location: [facility];  Service: Cardiovascular;  Laterality: Left;  . removal of dvt Left     No current outpatient medications on file.   No current facility-administered medications for this visit.    Allergies as of 08/19/2020 - Review Complete 08/19/2020  Allergen Reaction Noted  . Ativan [lorazepam] Other (See Comments)  05/28/2015  . Penicillins Other (See Comments) 05/28/2015    Family History  Problem Relation Age of Onset  . Clotting disorder Mother   . Cancer Paternal Uncle        colon   . Cancer Other        p. cousin colon cancer    Social History   Socioeconomic History  . Marital status: Single    Spouse name: Not on file  . Number of children: Not on file  . Years of education: Not on file  . Highest education level: Not on file  Occupational History  . Not on file  Tobacco Use  . Smoking status: Former Smoker    Types: Cigars    Quit date: 06/12/2017    Years since quitting: 3.1  . Smokeless tobacco: Never Used  Vaping Use  . Vaping Use: Never used  Substance and Sexual Activity  . Alcohol use: Yes    Alcohol/week: 6.0 standard drinks    Types: 6 Cans of beer per week    Comment: occasional, once a week  . Drug use: Yes    Types: Marijuana  . Sexual activity: Yes  Other Topics Concern  . Not on file  Social History Narrative   Daughter and step child    Married wife expecting 2nd child together 03/2018 total 3 kids    Used to be Environmental education officer    Social Determinants of  Health   Financial Resource Strain: Not on file  Food Insecurity: Not on file  Transportation Needs: Not on file  Physical Activity: Not on file  Stress: Not on file  Social Connections: Not on file  Intimate Partner Violence: Not on file     Physical Exam: BP 110/70   Pulse 82   Ht '5\' 6"'  (1.676 m)   Wt 198 lb (89.8 kg)   BMI 31.96 kg/m  Constitutional: generally well-appearing Psychiatric: alert and oriented x3 Eyes: extraocular movements intact Mouth: oral pharynx moist, no lesions Neck: supple no lymphadenopathy Cardiovascular: heart regular rate and rhythm Lungs: clear to auscultation bilaterally Abdomen: soft, nontender, nondistended, no obvious ascites, no peritoneal signs, normal bowel sounds Extremities: no lower extremity edema bilaterally Skin: no lesions on visible  extremities   Assessment and plan: 45 y.o. male with routine risk for colon cancer  I recommended colon cancer screening with a colonoscopy.  We discussed the risk and benefits including bleeding, perforation, missing a cancer, sedation related issues.  He understands and agrees to proceed.  I see no reason for any further blood tests or imaging studies prior to then.    Please see the "Patient Instructions" section for addition details about the plan.   Owens Loffler, MD [facility] 08/19/2020, 1:52 PM  Cc: McLean-Scocuzza, Olivia Mackie *  Total time on date of encounter was 25  minutes (this included time spent preparing to see the patient reviewing records; obtaining and/or reviewing separately obtained history; performing a medically appropriate exam and/or evaluation; counseling and educating the patient and family if present; ordering medications, tests or procedures if applicable; and documenting clinical information in the health record).

## 2020-08-19 NOTE — Patient Instructions (Signed)
If you are age 47 or younger, your body mass index should be between 19-25. Your Body mass index is 31.96 kg/m. If this is out of the aformentioned range listed, please consider follow up with your Primary Care Provider.   You have been scheduled for a colonoscopy. Please follow written instructions given to you at your visit today.  Please pick up your prep supplies at the pharmacy within the next 1-3 days. If you use inhalers (even only as needed), please bring them with you on the day of your procedure.  Due to recent changes in healthcare laws, you may see the results of your imaging and laboratory studies on MyChart before your provider has had a chance to review them.  We understand that in some cases there may be results that are confusing or concerning to you. Not all laboratory results come back in the same time frame and the provider may be waiting for multiple results in order to interpret others.  Please give [place] 48 hours in order for your provider to thoroughly review all the results before contacting the office for clarification of your results.   Thank you for entrusting me with your care and choosing [facility].  Dr Ardis Hughs

## 2020-09-18 ENCOUNTER — Encounter: Payer: Self-pay | Admitting: Gastroenterology

## 2020-09-23 ENCOUNTER — Ambulatory Visit (AMBULATORY_SURGERY_CENTER): Payer: No Typology Code available for payment source | Admitting: Gastroenterology

## 2020-09-23 ENCOUNTER — Encounter: Payer: Self-pay | Admitting: Gastroenterology

## 2020-09-23 ENCOUNTER — Other Ambulatory Visit: Payer: Self-pay

## 2020-09-23 VITALS — BP 116/76 | HR 64 | Temp 96.8°F | Resp 15 | Ht 66.0 in | Wt 198.0 lb

## 2020-09-23 DIAGNOSIS — Z1211 Encounter for screening for malignant neoplasm of colon: Secondary | ICD-10-CM

## 2020-09-23 DIAGNOSIS — K635 Polyp of colon: Secondary | ICD-10-CM

## 2020-09-23 DIAGNOSIS — D120 Benign neoplasm of cecum: Secondary | ICD-10-CM

## 2020-09-23 MED ORDER — SODIUM CHLORIDE 0.9 % IV SOLN
500.0000 mL | Freq: Once | INTRAVENOUS | Status: DC
Start: 2020-09-23 — End: 2020-09-23

## 2020-09-23 NOTE — Patient Instructions (Signed)
Information on polyps and diverticulosis given to you today.  Await pathology results.  Resume previous diet and medications.  YOU HAD AN ENDOSCOPIC PROCEDURE TODAY AT [facility]:   Refer to the procedure report that was given to you for any specific questions about what was found during the examination.  If the procedure report does not answer your questions, please call your gastroenterologist to clarify.  If you requested that your care partner not be given the details of your procedure findings, then the procedure report has been included in a sealed envelope for you to review at your convenience later.  YOU SHOULD EXPECT: Some feelings of bloating in the abdomen. Passage of more gas than usual.  Walking can help get rid of the air that was put into your GI tract during the procedure and reduce the bloating. If you had a lower endoscopy (such as a colonoscopy or flexible sigmoidoscopy) you may notice spotting of blood in your stool or on the toilet paper. If you underwent a bowel prep for your procedure, you may not have a normal bowel movement for a few days.  Please Note:  You might notice some irritation and congestion in your nose or some drainage.  This is from the oxygen used during your procedure.  There is no need for concern and it should clear up in a day or so.  SYMPTOMS TO REPORT IMMEDIATELY:   Following lower endoscopy (colonoscopy or flexible sigmoidoscopy):  Excessive amounts of blood in the stool  Significant tenderness or worsening of abdominal pains  Swelling of the abdomen that is new, acute  Fever of 100F or higher  F  For urgent or emergent issues, a gastroenterologist can be reached at any hour by calling [phone]. Do not use MyChart messaging for urgent concerns.    DIET:  We do recommend a small meal at first, but then you may proceed to your regular diet.  Drink plenty of fluids but you should avoid alcoholic beverages for 24  hours.  ACTIVITY:  You should plan to take it easy for the rest of today and you should NOT DRIVE or use heavy machinery until tomorrow (because of the sedation medicines used during the test).    FOLLOW UP: Our staff will call the number listed on your records 48-72 hours following your procedure to check on you and address any questions or concerns that you may have regarding the information given to you following your procedure. If we do not reach you, we will leave a message.  We will attempt to reach you two times.  During this call, we will ask if you have developed any symptoms of COVID 19. If you develop any symptoms (ie: fever, flu-like symptoms, shortness of breath, cough etc.) before then, please call [phone].  If you test positive for Covid 19 in the 2 weeks post procedure, please call and report this information to [place].    If any biopsies were taken you will be contacted by phone or by letter within the next 1-3 weeks.  Please call us at [phone] if you have not heard about the biopsies in 3 weeks.    SIGNATURES/CONFIDENTIALITY: You and/or your care partner have signed paperwork which will be entered into your electronic medical record.  These signatures attest to the fact that that the information above on your After Visit Summary has been reviewed and is understood.  Full responsibility of the confidentiality of this discharge information lies with you  and/or your care-partner. 

## 2020-09-23 NOTE — Progress Notes (Signed)
Report given to PACU, vss 

## 2020-09-23 NOTE — Progress Notes (Signed)
VS by CW  I have reviewed the patient's medical history in detail and updated the computerized patient record.  

## 2020-09-23 NOTE — Progress Notes (Signed)
Called to room to assist during endoscopic procedure.  Patient ID and intended procedure confirmed with present staff. Received instructions for my participation in the procedure from the performing physician.  

## 2020-09-23 NOTE — Op Note (Signed)
[facility] [place] Patient Name: Eric Cunningham Procedure Date: 09/23/2020 2:18 PM MRN: [id number] Endoscopist: Milus Banister , MD Age: 45 Referring MD:  Date of Birth: 10/05/1975 Gender: Male Account #: [id number] Procedure:                Colonoscopy Indications:              Screening for colorectal malignant neoplasm Medicines:                Monitored Anesthesia Care Procedure:                Pre-Anesthesia Assessment:                           - Prior to the procedure, a History and Physical                            was performed, and patient medications and                            allergies were reviewed. The patient's tolerance of                            previous anesthesia was also reviewed. The risks                            and benefits of the procedure and the sedation                            options and risks were discussed with the patient.                            All questions were answered, and informed consent                            was obtained. Prior Anticoagulants: The patient has                            taken no previous anticoagulant or antiplatelet                            agents. ASA Grade Assessment: II - A patient with                            mild systemic disease. After reviewing the risks                            and benefits, the patient was deemed in                            satisfactory condition to undergo the procedure.                           After obtaining informed consent, the colonoscope  was passed under direct vision. Throughout the                            procedure, the patient's blood pressure, pulse, and                            oxygen saturations were monitored continuously. The                            Olympus [id number]-[id number] (#[id number]) Colonoscope was                            introduced through the anus and advanced to the the                            cecum,  identified by appendiceal orifice and                            ileocecal valve. The colonoscopy was performed                            without difficulty. The patient tolerated the                            procedure well. The quality of the bowel                            preparation was good. The ileocecal valve,                            appendiceal orifice, and rectum were photographed. Scope In: 3:45:05 PM Scope Out: 3:58:05 PM Scope Withdrawal Time: 0 hours 9 minutes 3 seconds  Total Procedure Duration: 0 hours 13 minutes 0 seconds  Findings:                 A 3 mm polyp was found in the cecum. The polyp was                            sessile. The polyp was removed with a cold snare.                            Resection and retrieval were complete.                           A few small-mouthed diverticula were found in the                            left colon.                           The exam was otherwise without abnormality on                            direct and retroflexion views. Complications:  No immediate complications. Estimated blood loss:                            None. Estimated Blood Loss:     Estimated blood loss: none. Impression:               - One 3 mm polyp in the cecum, removed with a cold                            snare. Resected and retrieved.                           - Diverticulosis in the left colon.                           - The examination was otherwise normal on direct                            and retroflexion views. Recommendation:           - Patient has a contact number available for                            emergencies. The signs and symptoms of potential                            delayed complications were discussed with the                            patient. Return to normal activities tomorrow.                            Written discharge instructions were provided to the                            patient.                            - Resume previous diet.                           - Continue present medications.                           - Await pathology results. Milus Banister, MD 09/23/2020 4:00:32 PM This report has been signed electronically.

## 2020-09-25 ENCOUNTER — Telehealth: Payer: Self-pay | Admitting: *Deleted

## 2020-09-25 NOTE — Telephone Encounter (Signed)
  Follow up Call-  Call back number 09/23/2020  Post procedure Call Back phone  # [phone]  Permission to leave phone message Yes  Some recent data might be hidden     Patient questions:  Do you have a fever, pain , or abdominal swelling? No. Pain Score  0 *  Have you tolerated food without any problems? Yes.    Have you been able to return to your normal activities? Yes.    Do you have any questions about your discharge instructions: Diet   No. Medications  No. Follow up visit  No.  Do you have questions or concerns about your Care? No.  Actions: * If pain score is 4 or above: No action needed, pain <4.

## 2020-10-02 ENCOUNTER — Encounter: Payer: Self-pay | Admitting: Gastroenterology

## 2020-10-31 ENCOUNTER — Ambulatory Visit
Admission: RE | Admit: 2020-10-31 | Discharge: 2020-10-31 | Disposition: A | Discharge status: Home / Self Care | Payer: No Typology Code available for payment source | Source: Ambulatory Visit | Attending: Emergency Medicine | Admitting: Emergency Medicine

## 2020-10-31 ENCOUNTER — Other Ambulatory Visit: Payer: Self-pay

## 2020-10-31 VITALS — BP 123/76 | HR 66 | Temp 98.0°F | Resp 18 | Ht 66.0 in | Wt 190.0 lb

## 2020-10-31 DIAGNOSIS — L255 Unspecified contact dermatitis due to plants, except food: Secondary | ICD-10-CM

## 2020-10-31 DIAGNOSIS — L0390 Cellulitis, unspecified: Secondary | ICD-10-CM | POA: Diagnosis not present

## 2020-10-31 MED ORDER — PREDNISONE 10 MG (21) PO TBPK: ORAL_TABLET | ORAL | 0 refills | Status: DC

## 2020-10-31 MED ORDER — DOXYCYCLINE HYCLATE 100 MG PO CAPS: 100.0000 mg | ORAL_CAPSULE | Freq: Two times a day (BID) | ORAL | 0 refills | Status: DC

## 2020-10-31 NOTE — Discharge Instructions (Addendum)
Continue to apply calamine lotion to the rash on both forearms and keep the blisters that have not dried up covered until such time as I do.  Take the doxycycline twice daily with food for 10 days for treatment of the cellulitis.  Be mindful that this medication will make you more sensitive to the sun and you need to wear sunscreen when outside or being exposed to the sun.  Start the prednisone tomorrow morning and take it according to the package instructions to help you with itching and inflammation.  You may continue to use over-the-counter Benadryl and/or Allegra 180 mg daily to help with itching.  Return for reevaluation for new or worsening symptoms.

## 2020-10-31 NOTE — ED Triage Notes (Signed)
Patient c/o poison ivy on bilateral forearms that started over 1 week ago.

## 2020-10-31 NOTE — ED Provider Notes (Signed)
[facility]    CSN: [id number] Arrival date & time: 10/31/20  1259      History   Chief Complaint Chief Complaint  Patient presents with  . Poison Ivy    HPI Eric Cunningham is a 45 y.o. male.   HPI   45 year old male here for evaluation of rash on both arms.  Patient reports that he was exposed to poison ivy and it is been present on both arms and his abdomen for the past week.  Patient has been using calamine lotion at home to help with itching and is also been scrubbing with a pumice soap.  Patient states that he is concerned, as is his wife who is an NP, because the underlying skin and surrounding skin have become red and hot.  Patient states that he has not had a fever and he has not experienced any swelling of his lips or tongue.  The rash is not draining.  Past Medical History:  Diagnosis Date  . Asthma   . Chicken pox   . DVT (deep venous thrombosis) ([place])   . History of blood transfusion   . 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 Hyperlipidemia     Patient Active Problem List   Diagnosis Date Noted  . 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 Vitamin D deficiency 12/21/2018  . Tinea pedis 02/21/2018  . 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 Hyperlipidemia 08/26/2017  . Annual physical exam 12/30/2016  . History of DVT (deep vein thrombosis) 04/23/2016    Past Surgical History:  Procedure Laterality Date  . IVC FILTER REMOVAL N/A 06/12/2018   Procedure: IVC FILTER REMOVAL;  Surgeon: Katha Cabal, MD;  Location: [facility];  Service: Cardiovascular;  Laterality: N/A;  . PERIPHERAL VASCULAR CATHETERIZATION Left 04/26/2016   Procedure: Lower Extremity Venography and thrombectomy;  Surgeon: Katha Cabal, MD;  Location: [facility];  Service: Cardiovascular;  Laterality: Left;  . removal of dvt Left        Home Medications    Prior to Admission medications   Medication  Sig Start Date End Date Taking? Authorizing Provider  doxycycline (VIBRAMYCIN) 100 MG capsule Take 1 capsule (100 mg total) by mouth 2 (two) times daily. 10/31/20  Yes Margarette Canada, NP  predniSONE (STERAPRED UNI-PAK 21 TAB) 10 MG (21) TBPK tablet Take 6 tablets on day 1, 5 tablets day 2, 4 tablets day 3, 3 tablets day 4, 2 tablets day 5, 1 tablet day 6 10/31/20  Yes Margarette Canada, NP    Family History Family History  Problem Relation Age of Onset  . Clotting disorder Mother   . Cancer Paternal Uncle        colon   . Cancer Other        p. cousin colon cancer    Social History Social History   Tobacco Use  . Smoking status: Former Smoker    Types: Cigars    Quit date: 06/12/2017    Years since quitting: 3.3  . Smokeless tobacco: Never Used  Vaping Use  . Vaping Use: Never used  Substance Use Topics  . Alcohol use: Yes    Alcohol/week: 6.0 standard drinks    Types: 6 Cans of beer per week    Comment: occasional, once a week  . Drug use: Yes    Types: Marijuana     Allergies   Ativan [lorazepam]  and Penicillins   Review of Systems Review of Systems  Constitutional: Negative for activity change, appetite change and fever.  HENT: Negative for facial swelling and trouble swallowing.   Respiratory: Negative for shortness of breath.   Skin: Positive for color change, rash and wound.  Hematological: Negative.   Psychiatric/Behavioral: Negative.      Physical Exam Triage Vital Signs ED Triage Vitals [10/31/20 1316]  Enc Vitals Group     BP 123/76     Pulse Rate 66     Resp 18     Temp 98 F (36.7 C)     Temp Source Oral     SpO2 98 %     Weight 190 lb (86.2 kg)     Height 5\' 6"  (1.676 m)     Head Circumference      Peak Flow      Pain Score      Pain Loc      Pain Edu?      Excl. in [place]?    No data found.  Updated Vital Signs BP 123/76 (BP Location: Right Arm)   Pulse 66   Temp 98 F (36.7 C) (Oral)   Resp 18   Ht 5\' 6"  (1.676 m)   Wt 190 lb (86.2 kg)    SpO2 98%   BMI 30.67 kg/m   Visual Acuity Right Eye Distance:   Left Eye Distance:   Bilateral Distance:    Right Eye Near:   Left Eye Near:    Bilateral Near:     Physical Exam Vitals and nursing note reviewed.  Constitutional:      General: He is not in acute distress.    Appearance: Normal appearance. He is not ill-appearing.  HENT:     Head: Normocephalic and atraumatic.  Musculoskeletal:        General: No swelling or tenderness.  Skin:    General: Skin is warm and dry.     Capillary Refill: Capillary refill takes less than 2 seconds.     Findings: Erythema and rash present.  Neurological:     General: No focal deficit present.     Mental Status: He is alert and oriented to person, place, and time.  Psychiatric:        Mood and Affect: Mood normal.        Behavior: Behavior normal.        Thought Content: Thought content normal.        Judgment: Judgment normal.      UC Treatments / Results  Labs (all labs ordered are listed, but only abnormal results are displayed) Labs Reviewed - No data to display  EKG   Radiology No results found.  Procedures Procedures (including critical care time)  Medications Ordered in UC Medications - No data to display  Initial Impression / Assessment and Plan / UC Course  I have reviewed the triage vital signs and the nursing notes.  Pertinent labs & imaging results that were available during my care of the patient were reviewed by me and considered in my medical decision making (see chart for details).   Is a very pleasant 45 year old male here for evaluation of poison ivy on both forearms.  Patient does have drying vesicular lesions on both forearms but the surrounding tissue is also erythematous and warm.  Some of the vesicles do not look that these have completely dried up.  Patient also has dried vesicular lesions on his abdomen with surrounding erythema.  Patient's  exam is consistent with contact dermatitis and  underlying cellulitis.  We will treat patient with doxycycline twice daily for 10 days, prednisone for itching, and have him continue calamine lotion at home.   Final Clinical Impressions(s) / UC Diagnoses   Final diagnoses:  Contact dermatitis due to plants, except food, unspecified contact dermatitis type  Cellulitis, unspecified cellulitis site     Discharge Instructions     Continue to apply calamine lotion to the rash on both forearms and keep the blisters that have not dried up covered until such time as I do.  Take the doxycycline twice daily with food for 10 days for treatment of the cellulitis.  Be mindful that this medication will make you more sensitive to the sun and you need to wear sunscreen when outside or being exposed to the sun.  Start the prednisone tomorrow morning and take it according to the package instructions to help you with itching and inflammation.  You may continue to use over-the-counter Benadryl and/or Allegra 180 mg daily to help with itching.  Return for reevaluation for new or worsening symptoms.    ED Prescriptions    Medication Sig Dispense Auth. Provider   doxycycline (VIBRAMYCIN) 100 MG capsule Take 1 capsule (100 mg total) by mouth 2 (two) times daily. 20 capsule Margarette Canada, NP   predniSONE (STERAPRED UNI-PAK 21 TAB) 10 MG (21) TBPK tablet Take 6 tablets on day 1, 5 tablets day 2, 4 tablets day 3, 3 tablets day 4, 2 tablets day 5, 1 tablet day 6 21 tablet Margarette Canada, NP     PDMP not reviewed this encounter.   Margarette Canada, NP 10/31/20 1338

## 2020-12-25 ENCOUNTER — Telehealth: Payer: No Typology Code available for payment source | Admitting: Internal Medicine

## 2021-01-06 ENCOUNTER — Other Ambulatory Visit: Payer: Self-pay | Admitting: Internal Medicine

## 2021-01-06 ENCOUNTER — Telehealth: Payer: Self-pay | Admitting: Internal Medicine

## 2021-01-06 DIAGNOSIS — I82409 Acute embolism and thrombosis of unspecified deep veins of unspecified lower extremity: Secondary | ICD-10-CM

## 2021-01-06 DIAGNOSIS — D6859 Other primary thrombophilia: Secondary | ICD-10-CM

## 2021-01-06 MED ORDER — ENOXAPARIN SODIUM 40 MG/0.4ML IJ SOSY
44.0000 mg | PREFILLED_SYRINGE | INTRAMUSCULAR | 0 refills | Status: DC
Start: 2021-01-06 — End: 2021-01-07

## 2021-01-06 NOTE — Telephone Encounter (Signed)
[facility] [place] how long will he be gone? How long is the car or airplane ride?  If having trouble with cost advise how to do good Rx or coupon  Also take frequent walking breaks and movement

## 2021-01-06 NOTE — Telephone Encounter (Signed)
Pt called he is going on a long car trip and wanted to Lovenox called in since he has a history of DVT

## 2021-01-07 NOTE — Telephone Encounter (Signed)
Please advise on this message and the rx message sent by pharmacy about lovenox. 45mg  with only a 40mg  pen.

## 2021-01-07 NOTE — Telephone Encounter (Signed)
Pt wife called they need the rx to go to [facility].. She stated that they are going to [place] twice in the next two weeks 12 hours one way and wanted to know if that would be enough medication

## 2021-01-07 NOTE — Telephone Encounter (Signed)
For body size prior dose sent in slightly higher than 40 daily is recommended but spoke with pharmacy and 40 daily will be ok  See prior message need answers to questions please

## 2021-01-08 ENCOUNTER — Telehealth: Payer: Self-pay | Admitting: Internal Medicine

## 2021-01-08 ENCOUNTER — Telehealth: Payer: Self-pay

## 2021-01-08 NOTE — Telephone Encounter (Signed)
Patient called office and said someone called from this office. He did not know who it was or what it might be for, except for a medication refill.

## 2021-01-08 NOTE — Telephone Encounter (Signed)
See other notes

## 2021-01-08 NOTE — Telephone Encounter (Signed)
No answer, no voicemail.   If Patient calls back in needing to know how long will he be gone? How long is the car or airplane ride?

## 2021-01-08 NOTE — Telephone Encounter (Signed)
Per [place] message she needed the time frame for which patient is needing the rx.  he stated that they are going to [place] twice in the next two weeks 12 hours one way and wanted to know if that would be enough medication

## 2021-01-11 ENCOUNTER — Other Ambulatory Visit: Payer: Self-pay | Admitting: Internal Medicine

## 2021-01-11 ENCOUNTER — Other Ambulatory Visit: Payer: Self-pay

## 2021-01-11 DIAGNOSIS — D6859 Other primary thrombophilia: Secondary | ICD-10-CM

## 2021-01-11 DIAGNOSIS — I82409 Acute embolism and thrombosis of unspecified deep veins of unspecified lower extremity: Secondary | ICD-10-CM

## 2021-01-11 MED ORDER — ENOXAPARIN SODIUM 40 MG/0.4ML IJ SOSY
40.0000 mg | PREFILLED_SYRINGE | Freq: Every day | INTRAMUSCULAR | 2 refills | Status: DC
  Filled 2021-01-11: qty 10, 25d supply, fill #0

## 2021-01-11 NOTE — Telephone Encounter (Signed)
I think it will be enough but will give him refills if needed  He can pick up from another pharmacy in [facility] if needed and get Rx transferred if needed

## 2021-01-11 NOTE — Telephone Encounter (Signed)
Elpidio Galea T, CMA 3 days ago    Per [place] message she needed the time frame for which patient is needing the rx.   he stated that they are going to [place] twice in the next two weeks 12 hours one way and wanted to know if that would be enough medication     Please advise

## 2021-01-11 NOTE — Telephone Encounter (Signed)
Call if refills needed to transfer will do lovenox 40 mg daily for this trip with h/o protein S def and dvt pt did not want to be on long term anticoagulation per Dr. Grayland Ormond note 2017 want lovenox prn for long trips

## 2021-01-11 NOTE — Telephone Encounter (Signed)
Added to previous telephone encounter

## 2021-01-11 NOTE — Telephone Encounter (Signed)
Patient informed and verbalized understanding

## 2021-05-25 ENCOUNTER — Telehealth: Payer: Self-pay | Admitting: Internal Medicine

## 2021-05-25 NOTE — Telephone Encounter (Signed)
Patient is requesting a referral for [place] with [facility].

## 2021-05-25 NOTE — Telephone Encounter (Signed)
Patient last seen 05/2020 for physical. Does he need an appointment to discuss referral?

## 2021-05-26 ENCOUNTER — Encounter: Payer: No Typology Code available for payment source | Admitting: Internal Medicine

## 2021-05-28 NOTE — Telephone Encounter (Signed)
Patient's wife calling in and states that Patient was scheduled for a physical this week but was unable to get off work. Physical re-scheduled to earliest available in December.   Patient's insurance requires him to have a referral to be seen. Wanting to know if referral can be placed as Dr Enrigue Catena is already scheduling new patient's pretty far out and they do not want to wait until December for referral as their insurance changes in January anyway.   Please advise

## 2021-05-31 NOTE — Telephone Encounter (Signed)
They have asked for a referral but what is it for anxiety/depression/insomnia both?  What is the issue referral requested ?

## 2021-05-31 NOTE — Telephone Encounter (Signed)
Patient states he is needing referral for depression

## 2021-06-04 ENCOUNTER — Telehealth: Payer: Self-pay | Admitting: Internal Medicine

## 2021-06-04 NOTE — Telephone Encounter (Signed)
Patient's wife called about the status of the referral request. She would like a phone call back from office. She stated that this will be her 3rd time calling about referral.

## 2021-06-04 NOTE — Telephone Encounter (Signed)
Referral placed.

## 2021-06-04 NOTE — Addendum Note (Signed)
Addended by: Orland Mustard on: 06/04/2021 03:59 PM   Modules accepted: Orders

## 2021-06-04 NOTE — Telephone Encounter (Signed)
Okay to place referral?   I can place the referral in his last visit if yes

## 2021-06-07 NOTE — Telephone Encounter (Signed)
Patient informed and verbalized understanding

## 2021-06-09 ENCOUNTER — Ambulatory Visit (INDEPENDENT_AMBULATORY_CARE_PROVIDER_SITE_OTHER): Payer: No Typology Code available for payment source | Admitting: Psychology

## 2021-06-09 DIAGNOSIS — F3289 Other specified depressive episodes: Secondary | ICD-10-CM | POA: Diagnosis not present

## 2021-06-28 ENCOUNTER — Ambulatory Visit (INDEPENDENT_AMBULATORY_CARE_PROVIDER_SITE_OTHER): Payer: No Typology Code available for payment source | Admitting: Psychology

## 2021-06-28 DIAGNOSIS — F32A Depression, unspecified: Secondary | ICD-10-CM | POA: Insufficient documentation

## 2021-06-28 DIAGNOSIS — F3289 Other specified depressive episodes: Secondary | ICD-10-CM

## 2021-06-28 DIAGNOSIS — F339 Major depressive disorder, recurrent, unspecified: Secondary | ICD-10-CM | POA: Insufficient documentation

## 2021-06-28 NOTE — Progress Notes (Addendum)
[facility] Counselor/Therapist Progress Note  Patient ID: Eric Cunningham, MRN: [id number].   Date: 06/28/2021  Time Spent: 6:01 pm - 6:52 pm  Treatment Type: Individual Therapy  Reported Symptoms: Depression and Anxiety  Mental Status Exam: Appearance:  Neat and Well Groomed     Behavior: Appropriate  Motor: Normal  Speech/Language:  Normal Rate  Affect: Flat  Mood: normal  Thought process: normal  Thought content:   WNL  Sensory/Perceptual disturbances:   WNL  Orientation: oriented to person, place, time/date, and situation  Attention: Good  Concentration: Good  Memory: [place] of knowledge:  Good  Insight:   Good  Judgment:  Good  Impulse Control: Good   Risk Assessment: Danger to Self:  No Self-injurious Behavior: No Danger to Others: No Duty to Warn:no Physical Aggression / Violence:No  Access to Firearms a concern: No  Gang Involvement:No   Subjective:   Eric Cunningham participated car, via video, and consented to treatment. Therapist participated from home office. Eric Cunningham reviewed the events of the past week. We discussed Eric Cunningham's goals treatment goals which include managing his anger, addressing his frustration tolerance, processing past events, & engaging in self-care on a consistent basis. We reviewed numerous treatment approaches including CBT, ACT, BA, Problem Solving, and Solution focused therapy. Psych-education regarding the RIGHTEOUS CLAIBORNE diagnosis of Episode of recurrent major depressive disorder, unspecified depression episode severity ([place]), along with anxiety, was provided during the session. Therapist reviewed clinical goals with Eric Cunningham, who provided verbal approval of the treatment plan. Eric Cunningham discussed his interest in journaling, which he previously engaged in. Therapist praised Eric Cunningham for his goal and encouraged Eric Cunningham to set a reasonable journaling goal, with duration and  frequency limit. Therapist validated Eric Cunningham's feelings and experience and encouraged self-care Therapist provided supportive therapy.   Interventions:  Goal setting for Treatment Plan  Diagnosis:Episode of recurrent major depressive disorder, unspecified depression episode severity ([place])   Plan: Patient is to use CBT, ACT, mindfulness and coping skills to help manage decrease symptoms associated with their diagnosis. (Target Date: 2022-06-09)   Long-term goal:   Reduce overall level, frequency, and intensity of the feelings of depression & anxiety as evidenced by  decreased irritability and anger, negative self talk, and generalized worry feelings from 6 to 7 days/week to 0 to 1 days/week per client report for at least 3 consecutive months.  Short-term goal:  Verbally express understanding of the relationship between feelings of depression, anxiety and their impact on thinking patterns and behaviors. Verbalize an understanding of the role that distorted thinking plays in creating fears, excessive worry, and ruminations.  Buena Irish, LCSW

## 2021-06-29 NOTE — Addendum Note (Signed)
Addended by: Buena Irish on: 06/29/2021 12:11 PM   Modules accepted: Level of Service

## 2021-07-20 ENCOUNTER — Other Ambulatory Visit: Payer: Self-pay

## 2021-07-20 ENCOUNTER — Encounter: Payer: Self-pay | Admitting: Internal Medicine

## 2021-07-20 ENCOUNTER — Ambulatory Visit (INDEPENDENT_AMBULATORY_CARE_PROVIDER_SITE_OTHER): Payer: No Typology Code available for payment source | Admitting: Internal Medicine

## 2021-07-20 VITALS — BP 138/82 | HR 82 | Temp 97.6°F | Ht 66.54 in | Wt 206.8 lb

## 2021-07-20 DIAGNOSIS — F1010 Alcohol abuse, uncomplicated: Secondary | ICD-10-CM

## 2021-07-20 DIAGNOSIS — Z1389 Encounter for screening for other disorder: Secondary | ICD-10-CM | POA: Diagnosis not present

## 2021-07-20 DIAGNOSIS — Z86718 Personal history of other venous thrombosis and embolism: Secondary | ICD-10-CM

## 2021-07-20 DIAGNOSIS — Z1329 Encounter for screening for other suspected endocrine disorder: Secondary | ICD-10-CM

## 2021-07-20 DIAGNOSIS — F339 Major depressive disorder, recurrent, unspecified: Secondary | ICD-10-CM | POA: Diagnosis not present

## 2021-07-20 DIAGNOSIS — E538 Deficiency of other specified B group vitamins: Secondary | ICD-10-CM | POA: Diagnosis not present

## 2021-07-20 DIAGNOSIS — E559 Vitamin D deficiency, unspecified: Secondary | ICD-10-CM

## 2021-07-20 DIAGNOSIS — Z0000 Encounter for general adult medical examination without abnormal findings: Secondary | ICD-10-CM | POA: Diagnosis not present

## 2021-07-20 DIAGNOSIS — E785 Hyperlipidemia, unspecified: Secondary | ICD-10-CM | POA: Diagnosis not present

## 2021-07-20 DIAGNOSIS — F419 Anxiety disorder, unspecified: Secondary | ICD-10-CM

## 2021-07-20 DIAGNOSIS — F32A Depression, unspecified: Secondary | ICD-10-CM | POA: Diagnosis not present

## 2021-07-20 DIAGNOSIS — F1210 Cannabis abuse, uncomplicated: Secondary | ICD-10-CM | POA: Insufficient documentation

## 2021-07-20 DIAGNOSIS — F1994 Other psychoactive substance use, unspecified with psychoactive substance-induced mood disorder: Secondary | ICD-10-CM

## 2021-07-20 NOTE — Progress Notes (Addendum)
Chief Complaint  Patient presents with   Annual Exam   Annual  1. Mood down though phq 9 s core 4 and gad 7 score 7 he does drink occas. And edibles and thc use daily he states has increased thc use wife states he seems distant mood depression noted x 6 years on and off and mood gets down spoke with wife Tama Gander use 47 y.o  Daughter middle had adhd/depression/anxiety  Review of Systems  Constitutional:  Negative for weight loss.  HENT:  Negative for hearing loss.   Eyes:  Negative for blurred vision.  Respiratory:  Negative for shortness of breath.   Cardiovascular:  Negative for chest pain.  Gastrointestinal:  Negative for abdominal pain and blood in stool.  Musculoskeletal:  Negative for back pain.  Skin:  Negative for rash.  Neurological:  Negative for headaches.  Psychiatric/Behavioral:  Negative for depression.   Past Medical History:  Diagnosis Date   Asthma    Chicken pox    DVT (deep venous thrombosis) (HCC)    History of blood transfusion    History of DVT (deep vein thrombosis)    x 2 left leg 8 years apart after long road trips   Hyperlipidemia    Past Surgical History:  Procedure Laterality Date   IVC FILTER REMOVAL N/A 06/12/2018   Procedure: IVC FILTER REMOVAL;  Surgeon: Katha Cabal, MD;  Location: [facility];  Service: Cardiovascular;  Laterality: N/A;   PERIPHERAL VASCULAR CATHETERIZATION Left 04/26/2016   Procedure: Lower Extremity Venography and thrombectomy;  Surgeon: Katha Cabal, MD;  Location: [facility];  Service: Cardiovascular;  Laterality: Left;   removal of dvt Left    Family History  Problem Relation Age of Onset   Clotting disorder Mother    Cancer Paternal Uncle        colon    Cancer Other        p. cousin colon cancer   Social History   Socioeconomic History   Marital status: Single    Spouse name: Not on file   Number of children: Not on file   Years of education: Not on file   Highest education  level: Not on file  Occupational History   Not on file  Tobacco Use   Smoking status: Former    Types: Cigars    Quit date: 06/12/2017    Years since quitting: 4.1   Smokeless tobacco: Never  Vaping Use   Vaping Use: Never used  Substance and Sexual Activity   Alcohol use: Yes    Alcohol/week: 6.0 standard drinks    Types: 6 Cans of beer per week    Comment: occasional, once a week   Drug use: Yes    Types: Marijuana   Sexual activity: Yes  Other Topics Concern   Not on file  Social History Narrative   Daughter and step child    Married wife 18, 8 (ADHD) and 3 kids as of 07/20/21    Used to be Environmental education officer    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  Allergies  Allergen Reactions   Ativan [Lorazepam] Other (See Comments)    Patient states he was very anxious and very hyper.   Penicillins Other (See Comments)    Patient states his mother said he was allergic at birth. Unknown reation Has patient had a PCN reaction causing immediate rash, facial/tongue/throat swelling, SOB or lightheadedness with hypotension: Unknown Has patient had a PCN reaction causing severe rash involving mucus membranes or skin necrosis: Unknown Has patient had a PCN reaction that required hospitalization: Unknown Has patient had a PCN reaction occurring within the last 10 years: No If all of the above answers are "NO", then may proce   No results found for this or any previous visit (from the past 2160 hour(s)). Objective  Body mass index is 32.84 kg/m. Wt Readings from Last 3 Encounters:  07/20/21 206 lb 12.8  oz (93.8 kg)  10/31/20 190 lb (86.2 kg)  09/23/20 198 lb (89.8 kg)   Temp Readings from Last 3 Encounters:  07/20/21 97.6 F (36.4 C) (Temporal)  10/31/20 98 F (36.7 C) (Oral)  09/23/20 (!) 96.8 F (36 C)   BP Readings from Last 3 Encounters:  07/20/21 138/82  10/31/20 123/76  09/23/20 116/76   Pulse Readings from Last 3 Encounters:  07/20/21 82  10/31/20 66  09/23/20 64    Physical Exam Vitals and nursing note reviewed.  Constitutional:      Appearance: Normal appearance. He is well-developed and well-groomed. He is obese.  HENT:     Head: Normocephalic and atraumatic.  Eyes:     Conjunctiva/sclera: Conjunctivae normal.     Pupils: Pupils are equal, round, and reactive to light.  Cardiovascular:     Rate and Rhythm: Normal rate and regular rhythm.     Heart sounds: Normal heart sounds.  Pulmonary:     Effort: Pulmonary effort is normal. No respiratory distress.     Breath sounds: Normal breath sounds.  Abdominal:     Tenderness: There is no abdominal tenderness.  Skin:    General: Skin is warm and moist.  Neurological:     General: No focal deficit present.     Mental Status: He is alert and oriented to person, place, and time. Mental status is at baseline.     Sensory: Sensation is intact.     Motor: Motor function is intact.     Coordination: Coordination is intact.     Gait: Gait is intact. Gait normal.  Psychiatric:        Attention and Perception: Attention and perception normal.        Mood and Affect: Affect is flat.        Speech: Speech normal.        Behavior: Behavior normal. Behavior is cooperative.        Thought Content: Thought content normal.        Cognition and Memory: Cognition and memory normal.        Judgment: Judgment normal.     Comments: Flat affect and mood     Assessment  Plan  Annual physical exam - Plan: Comprehensive metabolic panel, CBC with Differential/Platelet See below   History of DVT (deep vein  thrombosis)  Depression, unspecified depression type - Plan: Ambulatory referral to Psychiatry Dr. Shea Evans Episode of recurrent major depressive disorder, unspecified depression episode severity ([place]) - Plan: Ambulatory referral to Psychiatry Anxiety - Plan: Ambulatory referral to Psychiatry Marijuana abuse Alcohol abuse Substance induced mood disorder ([place])   HM Declines flu shot  Tdap utd 08/24/17 Consider prevnar 13  Declines hiv and hep C check  rec mmr and hep B status  Moderna had 3/3  rec healthy diet choices and get wt down  Declines STD check  rec stop vap THC and etoh rec f/u dental   09/23/20 colonoscopy diverticulosis f/u in 5 years   Rec healthy diet and exercise    Hyperlipidemia, unspecified hyperlipidemia type - Plan: Lipid panel  Info rec healthy diet and exercise    Vitamin D deficiency - check vitamin D today    H/o dvt long trips will to anticoagulation    Provider: Dr. Olivia Mackie McLean-Scocuzza-Internal Medicine

## 2021-07-20 NOTE — Patient Instructions (Signed)
Dr. Shea Evans psychiatry   Phone Fax E-mail Address  [phone] Not available [facility]   [place] [address]

## 2021-07-21 ENCOUNTER — Encounter: Payer: Self-pay | Admitting: Internal Medicine

## 2021-07-21 ENCOUNTER — Telehealth: Payer: Self-pay | Admitting: Internal Medicine

## 2021-07-21 DIAGNOSIS — E538 Deficiency of other specified B group vitamins: Secondary | ICD-10-CM | POA: Insufficient documentation

## 2021-07-21 LAB — CBC WITH DIFFERENTIAL/PLATELET
Basophils Absolute: 0 10*3/uL (ref 0.0–0.1)
Basophils Relative: 0.5 % (ref 0.0–3.0)
Eosinophils Absolute: 0.1 10*3/uL (ref 0.0–0.7)
Eosinophils Relative: 0.6 % (ref 0.0–5.0)
HCT: 43.5 % (ref 39.0–52.0)
Hemoglobin: 14.7 g/dL (ref 13.0–17.0)
Lymphocytes Relative: 21 % (ref 12.0–46.0)
Lymphs Abs: 1.9 10*3/uL (ref 0.7–4.0)
MCHC: 33.7 g/dL (ref 30.0–36.0)
MCV: 88.5 fl (ref 78.0–100.0)
Monocytes Absolute: 0.8 10*3/uL (ref 0.1–1.0)
Monocytes Relative: 8.7 % (ref 3.0–12.0)
Neutro Abs: 6.2 10*3/uL (ref 1.4–7.7)
Neutrophils Relative %: 69.2 % (ref 43.0–77.0)
Platelets: 278 10*3/uL (ref 150.0–400.0)
RBC: 4.92 Mil/uL (ref 4.22–5.81)
RDW: 13.7 % (ref 11.5–15.5)
WBC: 9 10*3/uL (ref 4.0–10.5)

## 2021-07-21 LAB — LIPID PANEL
Cholesterol: 247 mg/dL — ABNORMAL HIGH (ref 0–200)
HDL: 75.3 mg/dL (ref >39.00)
LDL Cholesterol: 160 mg/dL — ABNORMAL HIGH (ref 0–99)
NonHDL: 171.98
Total CHOL/HDL Ratio: 3
Triglycerides: 60 mg/dL (ref 0.0–149.0)
VLDL: 12 mg/dL (ref 0.0–40.0)

## 2021-07-21 LAB — URINALYSIS, ROUTINE W REFLEX MICROSCOPIC
Bilirubin Urine: NEGATIVE
Hgb urine dipstick: NEGATIVE
Leukocytes,Ua: NEGATIVE
Nitrite: NEGATIVE
RBC / HPF: NONE SEEN (ref 0–?)
Specific Gravity, Urine: >=1.03 — AB (ref 1.000–1.030)
Total Protein, Urine: NEGATIVE
Urine Glucose: NEGATIVE
Urobilinogen, UA: 0.2 (ref 0.0–1.0)
pH: 6 (ref 5.0–8.0)

## 2021-07-21 LAB — COMPREHENSIVE METABOLIC PANEL
ALT: 20 U/L (ref 0–53)
AST: 18 U/L (ref 0–37)
Albumin: 4.5 g/dL (ref 3.5–5.2)
Alkaline Phosphatase: 71 U/L (ref 39–117)
BUN: 17 mg/dL (ref 6–23)
CO2: 26 mEq/L (ref 19–32)
Calcium: 9.2 mg/dL (ref 8.4–10.5)
Chloride: 100 mEq/L (ref 96–112)
Creatinine, Ser: 0.78 mg/dL (ref 0.40–1.50)
GFR: 106.59 mL/min (ref >60.00)
Glucose, Bld: 86 mg/dL (ref 70–99)
Potassium: 3.8 mEq/L (ref 3.5–5.1)
Sodium: 136 mEq/L (ref 135–145)
Total Bilirubin: 0.4 mg/dL (ref 0.2–1.2)
Total Protein: 7.3 g/dL (ref 6.0–8.3)

## 2021-07-21 LAB — VITAMIN B12: Vitamin B-12: 159 pg/mL — ABNORMAL LOW (ref 211–911)

## 2021-07-21 LAB — VITAMIN D 25 HYDROXY (VIT D DEFICIENCY, FRACTURES): VITD: 28.48 ng/mL — ABNORMAL LOW (ref 30.00–100.00)

## 2021-07-21 LAB — TSH: TSH: 1.62 u[IU]/mL (ref 0.35–5.50)

## 2021-07-21 NOTE — Telephone Encounter (Signed)
Called and spoke with wife

## 2021-07-21 NOTE — Telephone Encounter (Signed)
Please advise 

## 2021-07-21 NOTE — Telephone Encounter (Signed)
Patient's wife returned Dr Marica Otter  phone call and said that Dr Olivia Mackie called her while husband was in exam room yesterday to ask her a question.

## 2021-07-26 ENCOUNTER — Ambulatory Visit (INDEPENDENT_AMBULATORY_CARE_PROVIDER_SITE_OTHER): Payer: No Typology Code available for payment source | Admitting: Psychology

## 2021-07-26 DIAGNOSIS — F3289 Other specified depressive episodes: Secondary | ICD-10-CM

## 2021-07-26 NOTE — Progress Notes (Signed)
[facility] [employer] Counselor/Therapist Progress Note  Patient ID: RENJI BERWICK, MRN: [id number].   Date: 1/2//2023  Time Spent: 6:02 pm - 6:69 pm: 57 minutes  Treatment Type: Individual Therapy  Reported Symptoms: Depression and Anxiety  Mental Status Exam: Appearance:  Neat and Well Groomed     Behavior: Appropriate  Motor: Normal  Speech/Language:  Normal Rate  Affect: Flat  Mood: normal  Thought process: normal  Thought content:   WNL  Sensory/Perceptual disturbances:   WNL  Orientation: oriented to person, place, time/date, and situation  Attention: Good  Concentration: Good  Memory: [place] of knowledge:  Good  Insight:   Good  Judgment:  Good  Impulse Control: Good   Risk Assessment: Danger to Self:  No Self-injurious Behavior: No Danger to Others: No Duty to Warn:no Physical Aggression / Violence:No  Access to Firearms a concern: No  Gang Involvement:No   Subjective:   [name] participated car, via video, and consented to treatment. Therapist participated from home office. We met online to [place]. [name] reviewed the events of the past week. [name] noted feeling "weird" due to a recent referral for a psychiatrist and  noted not being able to "trust" his own brain.  We explored this during the session and his negative automatic thoughts regarding this referral.  He did express openness to follow up with this referral and discussed that his mood would possibly benefit from speaking with a specialist.  We explored his negative automatic thoughts regarding this and worked on identifying ways to view this referral more positively.  Therapist employed CBT techniques to work on challenging negative automatic thoughts and finding alternatives.  [name] provided additional history regarding his mood including the onset of his existential thinking which occurred after his father's passing.  He noted the need to "let go of" some of his  relationships, for himself, including the need to rectify things with his mother and brother, separately.  [name] discussed social and ethnic pressures to behave and act in certain ways.  He discussed the effects of his mood on his parenting and the need to manage his frustration with his children.  We explored this during the session and [name] discussed his recent efforts to exercise more often and to eat more healthfully.  Therapist praised this during the session and encouraged [name] to take short breaks, during frustrating moments, and to work on putting those frustrations in perspective including challenging those via "will this be a big deal in an hour, a week, a month".  Therapist highlighted [name]'s willingness to meet with a psychiatrist and to work on his mood actively.  Therapist validated [name]'s feelings and provided supportive therapy.  Also benefit from more frequent sessions and was scheduled for biweekly sessions for the next month.   Interventions: Cognitive Behavioral Therapy  Diagnosis:Other depression   Plan: Patient is to use CBT, ACT, mindfulness and coping skills to help manage decrease symptoms associated with their diagnosis. (Target Date: 2022-06-09)   Long-term goal:   Reduce overall level, frequency, and intensity of the feelings of depression & anxiety as evidenced by  decreased irritability and anger, negative self talk, and generalized worry feelings from 6 to 7 days/week to 0 to 1 days/week per client report for at least 3 consecutive months.  Short-term goal:  Verbally express understanding of the relationship between feelings of depression, anxiety and their impact on thinking patterns and behaviors. Verbalize an understanding of the role that distorted thinking  plays in creating fears, excessive worry, and ruminations.  Buena Irish, LCSW

## 2021-07-27 ENCOUNTER — Other Ambulatory Visit: Payer: Self-pay

## 2021-07-27 ENCOUNTER — Other Ambulatory Visit: Payer: Self-pay | Admitting: Internal Medicine

## 2021-07-27 DIAGNOSIS — E538 Deficiency of other specified B group vitamins: Secondary | ICD-10-CM

## 2021-07-27 MED ORDER — SYRINGE/NEEDLE (DISP) 25G X 1-1/2" 3 ML MISC
1.0000 | 1 refills | Status: AC
Start: 2021-07-27 — End: ?
  Filled 2021-07-27: qty 12, fill #0

## 2021-07-27 MED ORDER — CYANOCOBALAMIN 1000 MCG/ML IJ SOLN
1000.0000 ug | INTRAMUSCULAR | 1 refills | Status: AC
  Filled 2021-07-27: qty 3, 90d supply, fill #0
  Filled 2021-11-26: qty 3, 90d supply, fill #1
  Filled 2022-07-07: qty 3, 90d supply, fill #2

## 2021-08-02 ENCOUNTER — Telehealth: Payer: Self-pay | Admitting: Internal Medicine

## 2021-08-02 NOTE — Telephone Encounter (Signed)
Eric Cunningham, Eric Leach  Cunningham, Eric Glow, MD; Esau Grew R CDIOP means Chemical Dependency Intensive Outpatient Program.  We do not offer this program in our office, or treat patients that are using substances.  If you would like to refer him for this, you can send him to our office in [place] for [place].   He would need substance abuse treatment.  If you would like other resources:  [facility] [phone]  [facility] [phone]  [facility] at [phone].  [facility] [facility] [phone]  [employer] [facility] [phone]  [facility] [phone]   Let me know if you have any questions or concerns,  Daleen Snook

## 2021-08-02 NOTE — Telephone Encounter (Signed)
YoungBlood, Rasheedah R  Lilly, Drake Leach; McLean-Scocuzza, Nino Glow, MD Good afternoon!   Ok, Thank you for letting us know. I have attached Dr Aundra Dubin in the message, please see message below. Thank you!        Previous Messages   ----- Message -----  From: Tempie Donning  Sent: 07/30/2021   9:16 AM EST  To: Ashley Jacobs  Subject: RE: referral                                   Good Morning,  This referral was denied by providers.   - Substance induced mood disorder likely due to history of THC use .  I will recommend [facility] or referral to [facility] or places that offer substance abuse treatment prior to medication management if needed for mood since its likely secondary to substance use .  Unable to treat  with medications it if he is using THC heavily .  He could return after stopping THC or any other substances involved and we could assess for medication need.

## 2021-08-02 NOTE — Telephone Encounter (Signed)
Please call wife [facility] declined referral due to [facility] use   They can call [facility] for appt in [place] or [place] for psychiatry appointment if they do in person it is recommended   Call to schedule an appointment telephone, video or in person  No referral needed you call to schedule appointment    [facility]  [place] [address] [phone]    [facility] [place]  [address]  [place] [address]  [phone]   OR  Do [facility] [place] has more availability open

## 2021-08-03 ENCOUNTER — Encounter: Payer: Self-pay | Admitting: Internal Medicine

## 2021-08-03 NOTE — Telephone Encounter (Signed)
Patient informed and verbalized understanding.  States he would like information sent to [facility]. Done

## 2021-08-06 NOTE — Addendum Note (Signed)
Addended by: Orland Mustard on: 08/06/2021 04:38 PM   Modules accepted: Orders

## 2021-08-23 ENCOUNTER — Ambulatory Visit (INDEPENDENT_AMBULATORY_CARE_PROVIDER_SITE_OTHER): Payer: No Typology Code available for payment source | Admitting: Psychology

## 2021-08-23 DIAGNOSIS — F3289 Other specified depressive episodes: Secondary | ICD-10-CM | POA: Diagnosis not present

## 2021-08-23 NOTE — Progress Notes (Addendum)
[facility] Counselor/Therapist Progress Note  Patient ID: Eric Cunningham, MRN: [id number].   Date: 08/23/2021  Time Spent: 6:37 pm - 6:35 pm: 58 minutes  Treatment Type: Individual Therapy  Reported Symptoms: Depression and Anxiety  Mental Status Exam: Appearance:  Neat and Well Groomed     Behavior: Appropriate  Motor: Normal  Speech/Language:  Normal Rate  Affect: Flat  Mood: normal  Thought process: normal  Thought content:   WNL  Sensory/Perceptual disturbances:   WNL  Orientation: oriented to person, place, time/date, and situation  Attention: Good  Concentration: Good  Memory: [place] of knowledge:  Good  Insight:   Good  Judgment:  Good  Impulse Control: Good   Risk Assessment: Danger to Self:  No Self-injurious Behavior: No Danger to Others: No Duty to Warn:no Physical Aggression / Violence:No  Access to Firearms a concern: No  Gang Involvement:No   Subjective:   Eric Cunningham participated car, via video, and consented to treatment. Therapist participated from home office. We met online to [place]. Eric Cunningham reviewed the events of the past week.  Eric Cunningham discussed resuming resuming his home town and enjoying that experience overall.  He noted saying "goodbye to his own life".  We worked on processing this during the session and his adjustment to parenthood.  He noted frustration regarding his attempts to parent their children, his daughter in particular, and his wife's lack of consistency with discipline.  We explored this during the session.  Eric Cunningham discussed wishing that his daughter "respected" him.  He noted that his discipline is often met poorly and that his wife often resends discipline later on.  He noted feeling unheard and at times, parents, in regards to parenting and discipline.  We worked on processing issues during the session and discussing his previous attempts to manage this.  Therapist encouraged Eric Cunningham to  identify what he would like coparenting to look like in the household, what his concerns are, and ways to communicate concerns positively and assertively to his wife.  Therapist to parallel's past session in regards to Eric Cunningham's feelings of powerlessness and feeling unheard.  I also discussed being declined as the patient with a local psychiatrist due to his frequent marijuana use.  Eric Cunningham discussed smoking marijuana for the past 30 years and identified this behavior as mood management.  We will explore this going forward during our follow-up sessions.  Therapist validated and normalized [name] difficulty adjusting to parenthood and noted this being typical for all periods as they become the parents and throughout numerous developmental stages for the children.  We worked on identifying what he would like parenting to look like.  Eric Cunningham noted often feeling constrained by social expectations of what her father should look like including being the primary breadwinner and being the primary disciplinarian.  Therapist praised Social research officer, government for his effort and energy during the session, provided psychoeducation regarding consistency and discipline for parents, and provided supportive therapy.  Eric Cunningham would benefit from continued treatment and was scheduled for follow-up.   Interventions: Assertiveness/Communication and Interpersonal  Diagnosis:Other depression   Plan: Patient is to use CBT, ACT, mindfulness and coping skills to help manage decrease symptoms associated with their diagnosis. (Target Date: 2022-06-09)   Long-term goal:   Reduce overall level, frequency, and intensity of the feelings of depression & anxiety as evidenced by  decreased irritability and anger, negative self talk, and generalized worry feelings from 6 to 7 days/week to 0 to 1 days/week per client  report for at least 3 consecutive months.  Short-term goal:  Verbally express understanding of the relationship between feelings of depression, anxiety and  their impact on thinking patterns and behaviors. Verbalize an understanding of the role that distorted thinking plays in creating fears, excessive worry, and ruminations.  Buena Irish, LCSW

## 2021-09-02 ENCOUNTER — Telehealth: Payer: Self-pay | Admitting: Internal Medicine

## 2021-09-02 NOTE — Telephone Encounter (Signed)
Lft pt wife a vm to call ofc regarding pt referral. thanks

## 2021-09-02 NOTE — Telephone Encounter (Signed)
Pt wife called stating that they need a referral from [facility] in [place]

## 2021-09-06 ENCOUNTER — Ambulatory Visit (INDEPENDENT_AMBULATORY_CARE_PROVIDER_SITE_OTHER): Payer: No Typology Code available for payment source | Admitting: Psychology

## 2021-09-06 DIAGNOSIS — F3289 Other specified depressive episodes: Secondary | ICD-10-CM

## 2021-09-06 NOTE — Progress Notes (Signed)
[facility] Counselor/Therapist Progress Note  Patient ID: Eric Cunningham, MRN: [id number].   Date: 09/06/2021  Time Spent: 5:33 pm - 6:30pm: 57 minutes  Treatment Type: Individual Therapy  Reported Symptoms: Depression and Anxiety  Mental Status Exam: Appearance:  Neat and Well Groomed     Behavior: Appropriate  Motor: Normal  Speech/Language:  Normal Rate  Affect: Flat  Mood: normal  Thought process: normal  Thought content:   WNL  Sensory/Perceptual disturbances:   WNL  Orientation: oriented to person, place, time/date, and situation  Attention: Good  Concentration: Good  Memory: [place] of knowledge:  Good  Insight:   Good  Judgment:  Good  Impulse Control: Good   Risk Assessment: Danger to Self:  No Self-injurious Behavior: No Danger to Others: No Duty to Warn:no Physical Aggression / Violence:No  Access to Firearms a concern: No  Gang Involvement:No   Subjective:   Eric Cunningham participated car, via video, and consented to treatment. Therapist participated from home office. We met online to [place]. Eric Cunningham reviewed the events of the past week.  Eric Cunningham noted his efforts to be mindful in lieu of being introspective. Eric Cunningham noted a need to process a relationship stressors, with his wife, and noting having difficulty finding time and approach to discuss his concerns. We worked on processing this during the session and his previous attempts at managing these concerns. Eric Cunningham noted "I made decisions in my life that put me in this position", which we explored as well. He discussed his concerns regarding addressing these concerns including "misinterpretation" & getting "angry" at each other. Therapist reviewed the tenets of positive and assertive communication. Therapist modeled this during the session. Therapist encouraged Eric Cunningham to employ these skills during the conversation with his wife and to approach things positively. Additionally,  therapist encouraged Eric Cunningham to define his role as a father and how he sees his wife's role, as a mother, to be processed during follow-ups.  Therapist praised Social research officer, government for his effort and energy during the session, provided psychoeducation regarding consistency and discipline for parents, and provided supportive therapy.  Eric Cunningham would benefit from continued treatment and was scheduled for follow-up.   Interventions: Assertiveness/Communication and Interpersonal  Diagnosis:Other depression   Plan: Patient is to use CBT, ACT, mindfulness and coping skills to help manage decrease symptoms associated with their diagnosis. (Target Date: 2022-06-09)   Long-term goal:   Reduce overall level, frequency, and intensity of the feelings of depression & anxiety as evidenced by  decreased irritability and anger, negative self talk, and generalized worry feelings from 6 to 7 days/week to 0 to 1 days/week per client report for at least 3 consecutive months.  Short-term goal:  Verbally express understanding of the relationship between feelings of depression, anxiety and their impact on thinking patterns and behaviors. Verbalize an understanding of the role that distorted thinking plays in creating fears, excessive worry, and ruminations.  Buena Irish, LCSW

## 2021-09-20 ENCOUNTER — Ambulatory Visit (INDEPENDENT_AMBULATORY_CARE_PROVIDER_SITE_OTHER): Payer: No Typology Code available for payment source | Admitting: Psychology

## 2021-09-20 DIAGNOSIS — F3289 Other specified depressive episodes: Secondary | ICD-10-CM

## 2021-09-20 NOTE — Progress Notes (Signed)
[facility] Counselor/Therapist Progress Note  Patient ID: Eric Cunningham, MRN: [id number].   Date: 09/20/2021  Time Spent: 5:03 pm - 6:05 pm: 62 minutes  Treatment Type: Individual Therapy  Reported Symptoms: Depression and Anxiety  Mental Status Exam: Appearance:  Neat and Well Groomed     Behavior: Appropriate  Motor: Normal  Speech/Language:  Normal Rate  Affect: Flat  Mood: normal  Thought process: normal  Thought content:   WNL  Sensory/Perceptual disturbances:   WNL  Orientation: oriented to person, place, time/date, and situation  Attention: Good  Concentration: Good  Memory: [place] of knowledge:  Good  Insight:   Good  Judgment:  Good  Impulse Control: Good   Risk Assessment: Danger to Self:  No Self-injurious Behavior: No Danger to Others: No Duty to Warn:no Physical Aggression / Violence:No  Access to Firearms a concern: No  Gang Involvement:No   Subjective:   Eric Cunningham participated car, via video, and consented to treatment. Therapist participated from home office. We met online to [place]. Eric Cunningham reviewed the events of the past week. Eric Cunningham is engaging in enjoyable activities (working out and reading) and spending time with his family. He noted his attempts to communicate with his wife and noted it not going well due to misunderstandings and having to work through the problem. Therapist praised Social research officer, government for his communication and being proactive with his concerns. Eric Cunningham identified numerous feelings during the session including resentment for how things are and feeling impediment about lot in life, feeling about being unhealthy, and need to improve dynamics with his wife and children. He noted an insight, "I didn't come from a loving family. One of the reasons I didn't want kids". He noted being the first born in latin family and being a disappointment for father because he was not "athletic". He identified as a I was a  sensitive child. He noted "My father didn't express love" & "My father didn't teach me how to do anything". He noted barriers to improving his parenting approach including "I didn't have the example (parenting)", often feeling ingrained (response), societal pressures, & role for fathers. We processed and rescripted a recent disagreement with his daughter. We worked on identifying ways to engage with daughter in the way he would like to parent. Therapist encouraged Eric Cunningham to identify what he would like his parenting style to look like.  Therapist praised Social research officer, government for his effort and energy during the session  and provided supportive therapy.  Eric Cunningham would benefit from continued treatment and was scheduled for follow-up.   Interventions: Assertiveness/Communication and Interpersonal  Diagnosis:Other depression   Plan: Patient is to use CBT, ACT, mindfulness and coping skills to help manage decrease symptoms associated with their diagnosis. (Target Date: 2022-06-09)   Long-term goal:   Reduce overall level, frequency, and intensity of the feelings of depression & anxiety as evidenced by  decreased irritability and anger, negative self talk, and generalized worry feelings from 6 to 7 days/week to 0 to 1 days/week per client report for at least 3 consecutive months.  Short-term goal:  Verbally express understanding of the relationship between feelings of depression, anxiety and their impact on thinking patterns and behaviors. Verbalize an understanding of the role that distorted thinking plays in creating fears, excessive worry, and ruminations.  Buena Irish, LCSW

## 2021-09-21 ENCOUNTER — Telehealth: Payer: Self-pay | Admitting: Internal Medicine

## 2021-09-21 NOTE — Telephone Encounter (Signed)
Return pt wife call vm was full. I spoke with pt he stated he will let her know I called. Thanks

## 2021-09-21 NOTE — Telephone Encounter (Signed)
Patient's wife called about psychiatry referral.

## 2021-10-04 ENCOUNTER — Encounter: Payer: No Typology Code available for payment source | Admitting: Psychology

## 2021-10-04 NOTE — Progress Notes (Incomplete)
° ° ° ° ° ° ° ° ° ° ° ° ° ° °  Eric Tietje, LCSW °

## 2021-10-05 NOTE — Progress Notes (Signed)
This encounter was created in error - please disregard.

## 2021-10-25 ENCOUNTER — Ambulatory Visit (INDEPENDENT_AMBULATORY_CARE_PROVIDER_SITE_OTHER): Payer: No Typology Code available for payment source | Admitting: Psychology

## 2021-10-25 DIAGNOSIS — F3289 Other specified depressive episodes: Secondary | ICD-10-CM

## 2021-10-25 NOTE — Progress Notes (Signed)
[facility] Counselor/Therapist Progress Note ? ?Patient ID: Eric Cunningham, MRN: [id number].  ? ?Date: 10/25/2021 ? ?Time Spent: 5:01 pm - 6:00 pm: 59 minutes ? ?Treatment Type: Individual Therapy ? ?Reported Symptoms: Depression and Anxiety ? ?Mental Status Exam: ?Appearance:  Neat and Well Groomed     ?Behavior: Appropriate  ?Motor: Normal  ?Speech/Language:  Normal Rate  ?Affect: Flat  ?Mood: normal  ?Thought process: normal  ?Thought content:   WNL  ?Sensory/Perceptual disturbances:   WNL  ?Orientation: oriented to person, place, time/date, and situation  ?Attention: Good  ?Concentration: Good  ?Memory: WNL  ?Fund of knowledge:  Good  ?Insight:   Good  ?Judgment:  Good  ?Impulse Control: Good  ? ?Risk Assessment: ?Danger to Self:  No ?Self-injurious Behavior: No ?Danger to Others: No ?Duty to Warn:no ?Physical Aggression / Violence:No  ?Access to Firearms a concern: No  ?Gang Involvement:No  ? ?Subjective:  ? ?Eric Cunningham participated car, via video, and consented to treatment. Therapist participated from home office. We met online to [place]. Eric Cunningham reviewed the events of the past week. Eric Cunningham noted difficulty with his sleep the past night but noted possible caffeine usages affecting this. Eric Cunningham noted her family getting ill and noted wanting to get away but having more patience and more engaged. Eric Cunningham noted an identity crisis and having difficulty navigating this.  We explored this during the session and worked on delineating his experience.  Eric Cunningham discussed his difficulty with authority and noting having to express authority via parenting with his children.  We identified cognitive dissidence regarding this issue and worked on reframing how he sees his parenting until to identify a different perspective that might be more in line with being less authoritative.  We processed this during the session and Eric Cunningham's experience of lack of mystery in day-to-day "confusion"  regarding his typical days and How things occur day-to-day.  We will explore this going forward.  Therapist encouraged Eric Cunningham to identify book reviewing varying parental styles and to see if this might aid in his shifting schema regarding his parenting.  Eric Cunningham was engaged and motivated during the session.  Therapist praised Social research officer, government for his effort to engage in self-care and managing his mood via mindfulness and being proactive.  We scheduled numerous follow-ups.  Eric Cunningham discussed missing the previous appointment and we discussed the cause of this.  Therapist praised Social research officer, government for his effort and energy and provided supportive therapy. ? ?Interventions: Cognitive Behavioral Therapy ? ?Diagnosis:Other depression ? ? ?Plan: Patient is to use CBT, ACT, mindfulness and coping skills to help manage decrease symptoms associated with their diagnosis. (Target Date: 2022-06-09) ?  ?Long-term goal:   ?Reduce overall level, frequency, and intensity of the feelings of depression & anxiety as evidenced by  decreased irritability and anger, negative self talk, and generalized worry feelings from 6 to 7 days/week to 0 to 1 days/week per client report for at least 3 consecutive months. ? ?Short-term goal:  ?Verbally express understanding of the relationship between feelings of depression, anxiety and their impact on thinking patterns and behaviors. ?Verbalize an understanding of the role that distorted thinking plays in creating fears, excessive worry, and ruminations. ? ?Eric Irish, LCSW ?

## 2021-10-27 ENCOUNTER — Telehealth: Payer: Self-pay | Admitting: Internal Medicine

## 2021-10-27 NOTE — Telephone Encounter (Signed)
Patient's wife called about patient's psychiatry referral. It looks like referrals called patient on the 28th of February and may had been confused that referrals was calling for him not his wife. Wife request that she be called to set up psychiatry referral. She states that her husband really needs to see psychiatry. ?

## 2021-11-16 ENCOUNTER — Ambulatory Visit (INDEPENDENT_AMBULATORY_CARE_PROVIDER_SITE_OTHER): Payer: No Typology Code available for payment source | Admitting: Psychology

## 2021-11-16 DIAGNOSIS — F3289 Other specified depressive episodes: Secondary | ICD-10-CM

## 2021-11-16 NOTE — Progress Notes (Signed)
[facility] Counselor/Therapist Progress Note ? ?Patient ID: Eric Cunningham, MRN: [id number].  ? ?Date: 11/16/2021 ? ?Time Spent: 5:04 pm - 5:57 pm:  53 minutes ? ?Treatment Type: Individual Therapy ? ?Reported Symptoms: Depression and Anxiety ? ?Mental Status Exam: ?Appearance:  Neat and Well Groomed     ?Behavior: Appropriate  ?Motor: Normal  ?Speech/Language:  Normal Rate  ?Affect: Flat  ?Mood: normal  ?Thought process: normal  ?Thought content:   WNL  ?Sensory/Perceptual disturbances:   WNL  ?Orientation: oriented to person, place, time/date, and situation  ?Attention: Good  ?Concentration: Good  ?Memory: WNL  ?Fund of knowledge:  Good  ?Insight:   Good  ?Judgment:  Good  ?Impulse Control: Good  ? ?Risk Assessment: ?Danger to Self:  No ?Self-injurious Behavior: No ?Danger to Others: No ?Duty to Warn:no ?Physical Aggression / Violence:No  ?Access to Firearms a concern: No  ?Gang Involvement:No  ? ?Subjective:  ? ?[name] participated car, via video, and consented to treatment. Therapist participated from office. We met online to [place]. [name] reviewed the events of the past week. [name] noted a recent job change and his first day being May 8th. He discussed difficulty making friends as an adult. He noted the rise of interpersonal stressors with his current friend group. We explored this during the session and the effect of this on [name]'s mood and outlook. Therapist validated [name]'s concerns and discussed ways to address concerns via assertive communication. Therapist encouraged [name] to identify what he looks for in relationships to be processed going forward. Therapist praised Social research officer, government for his effort and energy and provided supportive therapy. ? ?Interventions: Interpersonal ? ?Diagnosis:Other depression ? ? ?Plan: Patient is to use CBT, ACT, mindfulness and coping skills to help manage decrease symptoms associated with their diagnosis. (Target Date: 2022-06-09) ?   ?Long-term goal:   ?Reduce overall level, frequency, and intensity of the feelings of depression & anxiety as evidenced by  decreased irritability and anger, negative self talk, and generalized worry feelings from 6 to 7 days/week to 0 to 1 days/week per client report for at least 3 consecutive months. ? ?Short-term goal:  ?Verbally express understanding of the relationship between feelings of depression, anxiety and their impact on thinking patterns and behaviors. ?Verbalize an understanding of the role that distorted thinking plays in creating fears, excessive worry, and ruminations. ? ?Buena Irish, LCSW ?

## 2021-11-26 ENCOUNTER — Other Ambulatory Visit: Payer: Self-pay

## 2021-12-06 ENCOUNTER — Ambulatory Visit (INDEPENDENT_AMBULATORY_CARE_PROVIDER_SITE_OTHER): Payer: No Typology Code available for payment source | Admitting: Psychology

## 2021-12-06 DIAGNOSIS — F3289 Other specified depressive episodes: Secondary | ICD-10-CM | POA: Diagnosis not present

## 2021-12-06 NOTE — Progress Notes (Signed)
[facility] Counselor/Therapist Progress Note ? ?Patient ID: Eric Cunningham, MRN: [id number].  ? ?Date: 12/06/2021 ? ?Time Spent: 5:02 pm - 6:02 pm:  60 minutes ? ?Treatment Type: Individual Therapy ? ?Reported Symptoms: Depression and Anxiety ? ?Mental Status Exam: ?Appearance:  Neat and Well Groomed     ?Behavior: Appropriate  ?Motor: Normal  ?Speech/Language:  Normal Rate  ?Affect: Flat  ?Mood: normal  ?Thought process: normal  ?Thought content:   WNL  ?Sensory/Perceptual disturbances:   WNL  ?Orientation: oriented to person, place, time/date, and situation  ?Attention: Good  ?Concentration: Good  ?Memory: WNL  ?Fund of knowledge:  Good  ?Insight:   Good  ?Judgment:  Good  ?Impulse Control: Good  ? ?Risk Assessment: ?Danger to Self:  No ?Self-injurious Behavior: No ?Danger to Others: No ?Duty to Warn:no ?Physical Aggression / Violence:No  ?Access to Firearms a concern: No  ?Gang Involvement:No  ? ?Subjective:  ? ?Eric Cunningham participated car, via video, and consented to treatment. Therapist participated from home office. We met online to [place] [address]. Eric Cunningham reviewed the events of the past week. He noted starting his new job and noted working longer hours but with better pay. He endorsed childhood hyperactivity that affected him academically. Therapist provided psycho-education regarding ADHD and recommended a screening by a psychiatrist. Resources were previously provided by his PCP for local providers to pursue. Therapist encouraged follow-up with a psychiatrist to discuss possible medications, if clinically appropriate. He noted a need to work on his anger, admitting that he has made some progress, but questions how much. He noted his anger due to not having choices regarding his childhood, upbringing, and adult life. He noted feeling like he could have made "better choices". We highlighted feelings of "powerless". We will work on exploring this going forward. Therapist  introduced ACT and will review this in-depth, going forward including possible book recommendations.  ?Eric Cunningham was engaged and motivated. Therapist praised Social research officer, government for his effort and energy and provided supportive therapy. ? ?Interventions: Cognitive Behavioral Therapy and ACT ? ?Diagnosis:Other depression ? ? ?Plan: Patient is to use CBT, ACT, mindfulness and coping skills to help manage decrease symptoms associated with their diagnosis. (Target Date: 2022-06-09) ?  ?Long-term goal:   ?Reduce overall level, frequency, and intensity of the feelings of depression & anxiety as evidenced by  decreased irritability and anger, negative self talk, and generalized worry feelings from 6 to 7 days/week to 0 to 1 days/week per client report for at least 3 consecutive months. ? ?Short-term goal:  ?Verbally express understanding of the relationship between feelings of depression, anxiety and their impact on thinking patterns and behaviors. ?Verbalize an understanding of the role that distorted thinking plays in creating fears, excessive worry, and ruminations. ? ?Buena Irish, LCSW ?

## 2021-12-21 ENCOUNTER — Ambulatory Visit: Payer: No Typology Code available for payment source | Admitting: Psychology

## 2022-01-04 ENCOUNTER — Ambulatory Visit (INDEPENDENT_AMBULATORY_CARE_PROVIDER_SITE_OTHER): Payer: No Typology Code available for payment source | Admitting: Psychology

## 2022-01-04 DIAGNOSIS — F3289 Other specified depressive episodes: Secondary | ICD-10-CM

## 2022-01-04 NOTE — Progress Notes (Signed)
[place] Counselor/Therapist Progress Note  Patient ID: Eric Cunningham, MRN: [id number].   Date: 01/04/2022  Time Spent: 5:03 pm - 5:53 pm:  50 minutes  Treatment Type: Individual Therapy  Reported Symptoms: Depression and Anxiety  Mental Status Exam: Appearance:  Neat and Well Groomed     Behavior: Appropriate  Motor: Normal  Speech/Language:  Normal Rate  Affect: Flat  Mood: dysthymic  Thought process: normal  Thought content:   WNL  Sensory/Perceptual disturbances:   WNL  Orientation: oriented to person, place, time/date, and situation  Attention: Good  Concentration: Good  Memory: [place] of knowledge:  Good  Insight:   Good  Judgment:  Good  Impulse Control: Good   Risk Assessment: Danger to Self:  No Self-injurious Behavior: No Danger to Others: No Duty to Warn:no Physical Aggression / Violence:No  Access to Firearms a concern: No  Gang Involvement:No   Subjective:   Eric Cunningham participated car, via video, and consented to treatment. Therapist participated from home office. We met online to [place]. Eric Cunningham reviewed the events of the past week. Eric Cunningham noted pursuing a different perspective regarding "life in this country".  He noted the injustices that people experience and noted feeling angry and bitter. He noted needing to redirect his feelings in a more healthy manner. He noted feeling powerless and discussed the feelings this results in. We worked on identifying alternative avenues to manage his frustration.  We worked on identifying alternatives during the session.  We discussed his feelings, in detail, and worked on identifying alternatives to affect positive change.  Therapist encouraged Eric Cunningham to be more mindful of his stressors prior to returning home and to work on alternative coping skills in lieu of displacement.  Psychoeducation regarding this placement was provided during the session.  Therapist validated and  normalized Eric Cunningham's feelings of inequity in the world, lack of an even playing field, and the frustration that 1 can experience as a result.  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  Therapist encouraged Eric Cunningham to identify ways to affect change, if possible, even in minor ways.  [name] was engaged and motivated during the session.  He expressed commitment towards his goals.  We scheduled a follow-up.  Therapist provided supportive therapy.  Interventions: Cognitive Behavioral Therapy and ACT  Diagnosis:Other depression   Plan: Patient is to use CBT, ACT, mindfulness and coping skills to help manage decrease symptoms associated with their diagnosis. (Target Date: 2022-06-09)   Long-term goal:   Reduce overall level, frequency, and intensity of the feelings of depression & anxiety as evidenced by  decreased irritability and anger, negative self talk, and generalized worry feelings from 6 to 7 days/week to 0 to 1 days/week per client report for at least 3 consecutive months.  Short-term goal:  Verbally express understanding of the relationship between feelings of depression, anxiety and their impact on thinking patterns and behaviors. Verbalize an understanding of the role that distorted thinking plays in creating fears, excessive worry, and ruminations.  Buena Irish, LCSW

## 2022-01-18 ENCOUNTER — Ambulatory Visit (INDEPENDENT_AMBULATORY_CARE_PROVIDER_SITE_OTHER): Payer: No Typology Code available for payment source | Admitting: Internal Medicine

## 2022-01-18 ENCOUNTER — Encounter: Payer: Self-pay | Admitting: Internal Medicine

## 2022-01-18 ENCOUNTER — Other Ambulatory Visit: Payer: Self-pay

## 2022-01-18 VITALS — BP 120/80 | HR 80 | Temp 98.0°F | Resp 14 | Ht 66.54 in | Wt 200.0 lb

## 2022-01-18 DIAGNOSIS — M25561 Pain in right knee: Secondary | ICD-10-CM | POA: Diagnosis not present

## 2022-01-18 DIAGNOSIS — I82409 Acute embolism and thrombosis of unspecified deep veins of unspecified lower extremity: Secondary | ICD-10-CM | POA: Diagnosis not present

## 2022-01-18 DIAGNOSIS — D6859 Other primary thrombophilia: Secondary | ICD-10-CM | POA: Diagnosis not present

## 2022-01-18 DIAGNOSIS — M25361 Other instability, right knee: Secondary | ICD-10-CM

## 2022-01-18 DIAGNOSIS — R936 Abnormal findings on diagnostic imaging of limbs: Secondary | ICD-10-CM

## 2022-01-18 MED ORDER — ENOXAPARIN SODIUM 40 MG/0.4ML IJ SOSY
40.0000 mg | PREFILLED_SYRINGE | Freq: Every day | INTRAMUSCULAR | 2 refills | Status: DC
  Filled 2022-01-18: qty 10, 25d supply, fill #0

## 2022-01-18 NOTE — Progress Notes (Addendum)
Chief Complaint  Patient presents with   Follow-up    6 mon, c/o R knee pain, denies any recent fall or injury, pain is constant w/movement. Has not seen psychiatry   F/u  1. Right knee pain mid knee and lateral knee and pain radiates to lower legs and h/o trauma digging in the yard in 2021 saw [facility] knee xray neg pain at times constant with bending the knee and movement or standing w/o support in shoes or walking barefoot. [facility] gave knee brace w/o relief  6-7/10  He has swelling and pain bothering him more frequently since initially starting in 2021    Review of Systems  Constitutional:  Negative for weight loss.  HENT:  Negative for hearing loss.   Eyes:  Negative for blurred vision.  Respiratory:  Negative for shortness of breath.   Cardiovascular:  Negative for chest pain.  Gastrointestinal:  Negative for abdominal pain and blood in stool.  Musculoskeletal:  Positive for joint pain. Negative for back pain.  Skin:  Negative for rash.  Neurological:  Negative for headaches.  Psychiatric/Behavioral:  Negative for depression.    Past Medical History:  Diagnosis Date   Asthma    Chicken pox    DVT (deep venous thrombosis) (HCC)    History of blood transfusion    History of DVT (deep vein thrombosis)    x 2 left leg 8 years apart after long road trips   Hyperlipidemia    Past Surgical History:  Procedure Laterality Date   IVC FILTER REMOVAL N/A 06/12/2018   Procedure: IVC FILTER REMOVAL;  Surgeon: Katha Cabal, MD;  Location: [facility];  Service: Cardiovascular;  Laterality: N/A;   PERIPHERAL VASCULAR CATHETERIZATION Left 04/26/2016   Procedure: Lower Extremity Venography and thrombectomy;  Surgeon: Katha Cabal, MD;  Location: [facility];  Service: Cardiovascular;  Laterality: Left;   removal of dvt Left    Family History  Problem Relation Age of Onset   Clotting disorder Mother    Cancer Paternal Uncle        colon    Cancer  Other        p. cousin colon cancer   Mental illness Daughter        anxiety/depression/adhd   Social History   Socioeconomic History   Marital status: Single    Spouse name: Not on file   Number of children: Not on file   Years of education: Not on file   Highest education level: Not on file  Occupational History   Not on file  Tobacco Use   Smoking status: Former    Types: Cigars    Quit date: 06/12/2017    Years since quitting: 4.6   Smokeless tobacco: Never  Vaping Use   Vaping Use: Never used  Substance and Sexual Activity   Alcohol use: Yes    Alcohol/week: 6.0 standard drinks of alcohol    Types: 6 Cans of beer per week    Comment: occasional, once a week   Drug use: Yes    Types: Marijuana   Sexual activity: Yes  Other Topics Concern   Not on file  Social History Narrative   Daughter and step child    Married wife 90, 8 (ADHD) and 3 kids as of 07/20/21    Used to be Environmental education officer    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   SYRINGE-NEEDLE, DISP, 3 ML 25G X 1-1/2" 3 ML MISC 1 Device by Does not apply route every 30 (thirty) days.   Allergies  Allergen Reactions   Ativan [Lorazepam] Other (See Comments)    Patient states he was very anxious and very hyper.   Penicillins Other (See Comments)    Patient states his mother said he was allergic at birth. Unknown reation Has patient had a PCN reaction causing immediate rash, facial/tongue/throat swelling, SOB or lightheadedness with hypotension: Unknown Has patient had a PCN  reaction causing severe rash involving mucus membranes or skin necrosis: Unknown Has patient had a PCN reaction that required hospitalization: Unknown Has patient had a PCN reaction occurring within the last 10 years: No If all of the above answers are "NO", then may proce   No results found for this or any previous visit (from the past 2160 hour(s)). Objective  Body mass index is 31.76 kg/m. Wt Readings from Last 3 Encounters:  01/18/22 200 lb (90.7 kg)  07/20/21 206 lb 12.8 oz (93.8 kg)  10/31/20 190 lb (86.2 kg)   Temp Readings from Last 3 Encounters:  01/18/22 98 F (36.7 C) (Oral)  07/20/21 97.6 F (36.4 C) (Temporal)  10/31/20 98 F (36.7 C) (Oral)   BP Readings from Last 3 Encounters:  01/18/22 120/80  07/20/21 138/82  10/31/20 123/76   Pulse Readings from Last 3 Encounters:  01/18/22 80  07/20/21 82  10/31/20 66    Physical Exam Vitals and nursing note reviewed.  Constitutional:      Appearance: Normal appearance. He is well-developed and well-groomed.  HENT:     Head: Normocephalic and atraumatic.  Eyes:     Conjunctiva/sclera: Conjunctivae normal.     Pupils: Pupils are equal, round, and reactive to light.  Cardiovascular:     Rate and Rhythm: Normal rate and regular rhythm.     Heart sounds: Normal heart sounds.  Pulmonary:     Effort: Pulmonary effort is normal. No respiratory distress.     Breath sounds: Normal breath sounds.  Abdominal:     Tenderness: There is no abdominal tenderness.  Musculoskeletal:     Right knee: Swelling present. Tenderness present over the lateral joint line and ACL.  Skin:    General: Skin is warm and moist.  Neurological:     General: No focal deficit present.     Mental Status: He is alert and oriented to person, place, and time. Mental status is at baseline.     Sensory: Sensation is intact.     Motor: Motor function is intact.     Coordination: Coordination is intact.     Gait: Gait is intact. Gait normal.   Psychiatric:        Attention and Perception: Attention and perception normal.        Mood and Affect: Mood and affect normal.        Speech: Speech normal.        Behavior: Behavior normal. Behavior is cooperative.        Thought Content: Thought content normal.  Cognition and Memory: Cognition and memory normal.        Judgment: Judgment normal.     Assessment  Plan  Acute pain of right knee - Plan: MR Knee Right Wo Contrast Instability of right knee joint - Plan: MR Knee Right Wo Contrast  Consider Dr. Harlow M[facility]  IMPRESSION: 1. Radial tear at the lateral meniscus anterior horn/body junction with 14 mm parameniscal cyst. 2. Free edge fraying of the medial meniscus and lateral meniscus posterior horns. 3. Mild tricompartmental osteoarthritis as described above. 4. Moderate joint effusion. 5.9 cm Baker cyst.     Electronically Signed   By: Titus Dubin M.D.   On: 02/04/2022 12:13  HM Declines flu shot  Tdap utd 08/24/17 Consider prevnar 13  Declines hiv and hep C check  rec mmr and hep B status  Moderna had 3/3  rec healthy diet choices and get wt down  Declines STD check  rec stop vap THC and etoh rec f/u dental    09/23/20 colonoscopy diverticulosis f/u in 5 years    Rec healthy diet and exercise Provider: Dr. Olivia Mackie McLean-Scocuzza-[facility]

## 2022-02-02 ENCOUNTER — Ambulatory Visit
Admission: RE | Admit: 2022-02-02 | Discharge: 2022-02-02 | Disposition: A | Discharge status: Home / Self Care | Payer: No Typology Code available for payment source | Source: Ambulatory Visit | Attending: Internal Medicine | Admitting: Internal Medicine

## 2022-02-02 DIAGNOSIS — M25561 Pain in right knee: Secondary | ICD-10-CM | POA: Insufficient documentation

## 2022-02-02 DIAGNOSIS — M25361 Other instability, right knee: Secondary | ICD-10-CM | POA: Diagnosis present

## 2022-02-04 ENCOUNTER — Telehealth: Payer: Self-pay

## 2022-02-04 NOTE — Telephone Encounter (Signed)
Patient friend, call back to say yes to the Ortho referral and they prefer [facility]

## 2022-02-04 NOTE — Telephone Encounter (Signed)
Pt has seen results via mychart on 02/04/2022 1:42 PM  Lvm for pt to return call to see if agreeable to ortho referral

## 2022-02-07 ENCOUNTER — Ambulatory Visit (HOSPITAL_COMMUNITY): Payer: No Typology Code available for payment source | Admitting: Psychiatry

## 2022-02-09 DIAGNOSIS — R936 Abnormal findings on diagnostic imaging of limbs: Secondary | ICD-10-CM | POA: Insufficient documentation

## 2022-02-09 NOTE — Addendum Note (Signed)
Addended by: Orland Mustard on: 02/09/2022 12:53 PM   Modules accepted: Orders

## 2022-02-15 ENCOUNTER — Ambulatory Visit (INDEPENDENT_AMBULATORY_CARE_PROVIDER_SITE_OTHER): Payer: No Typology Code available for payment source | Admitting: Psychology

## 2022-02-15 DIAGNOSIS — F3289 Other specified depressive episodes: Secondary | ICD-10-CM | POA: Diagnosis not present

## 2022-02-15 NOTE — Progress Notes (Signed)
[place] Counselor/Therapist Progress Note  Patient ID: TEMITAYO COVALT, MRN: [id number].   Date: 02/15/2022  Time Spent: 5:07 pm - 5:50 pm:  43 minutes  Treatment Type: Individual Therapy  Reported Symptoms: Depression and Anxiety  Mental Status Exam: Appearance:  Neat and Well Groomed     Behavior: Appropriate  Motor: Normal  Speech/Language:  Normal Rate  Affect: Flat  Mood: dysthymic  Thought process: normal  Thought content:   WNL  Sensory/Perceptual disturbances:   WNL  Orientation: oriented to person, place, time/date, and situation  Attention: Good  Concentration: Good  Memory: [place] of knowledge:  Good  Insight:   Good  Judgment:  Good  Impulse Control: Good   Risk Assessment: Danger to Self:  No Self-injurious Behavior: No Danger to Others: No Duty to Warn:no Physical Aggression / Violence:No  Access to Firearms a concern: No  Gang Involvement:No   Subjective:   [name] participated car, via video, and consented to treatment. Therapist participated from home office. We met online to [place]. [name] reviewed the events of the past week. He noted his efforts of disconnecting from electronics and social media and being more present and mindful. He noted interest in being involved with his children, in  their own interest, and the perceived barriers related to. We worked on identifying barriers and addressing them via problem-solving. He noted difficulty "pulling" himself out of a sour or surely mood. [name] endorsed rumination, which we xplored during the session. He discussed difficulty with managing this. We processed this and explored possible ways to manage this going forward via mindfulness and identifying areas of control and lack of control. [name] was engaged and motivated during the session. We scheduled a follow-up for continued treatment. Therapist provided supportive therapy.   Interventions: Cognitive  Behavioral Therapy and ACT  Diagnosis:Other depression   Plan: Patient is to use CBT, ACT, mindfulness and coping skills to help manage decrease symptoms associated with their diagnosis. (Target Date: 2022-06-09)   Long-term goal:   Reduce overall level, frequency, and intensity of the feelings of depression & anxiety as evidenced by  decreased irritability and anger, negative self talk, and generalized worry feelings from 6 to 7 days/week to 0 to 1 days/week per client report for at least 3 consecutive months.  Short-term goal:  Verbally express understanding of the relationship between feelings of depression, anxiety and their impact on thinking patterns and behaviors. Verbalize an understanding of the role that distorted thinking plays in creating fears, excessive worry, and ruminations.  Buena Irish, LCSW

## 2022-03-28 ENCOUNTER — Ambulatory Visit (INDEPENDENT_AMBULATORY_CARE_PROVIDER_SITE_OTHER): Payer: No Typology Code available for payment source | Admitting: Psychology

## 2022-03-28 DIAGNOSIS — F339 Major depressive disorder, recurrent, unspecified: Secondary | ICD-10-CM

## 2022-03-28 NOTE — Progress Notes (Signed)
Eric Cunningham/Therapist Progress Note  Patient ID: Eric Cunningham, MRN: [id number].   Date: 03/28/2022  Time Spent: 5:36 pm - 6:23 pm:  47 minutes  Treatment Type: Individual Therapy  Reported Symptoms: Depression and Anxiety  Mental Status Exam: Appearance:  Neat and Well Groomed     Behavior: Appropriate  Motor: Normal  Speech/Language:  Normal Rate  Affect: Flat  Mood: dysthymic  Thought process: normal  Thought content:   WNL  Sensory/Perceptual disturbances:   WNL  Orientation: oriented to person, place, time/date, and situation  Attention: Good  Concentration: Good  Memory: [place] of knowledge:  Good  Insight:   Good  Judgment:  Good  Impulse Control: Good   Risk Assessment: Danger to Self:  No Self-injurious Behavior: No Danger to Others: No Duty to Warn:no Physical Aggression / Violence:No  Access to Firearms a concern: No  Gang Involvement:No   Subjective:   Eric Cunningham participated car, via video, and consented to treatment. Therapist participated from home office. We met online to Eric Cunningham. Eric Cunningham reviewed the events of the past week. He noted "some days being better than others". He noted an upcoming transition, his wife's changing work schedule. He noted feeling hopeful that they will feel less burned out. He noted upcoming knee surgery due to a torn mensicus and multiple cysts to address. He anticipates being out for a week from work followed by 5 weeks of no exercise. We discussed his follow-up, or lack their of, of psychiatric treatment. He noted this being a point of contention between he and his wife. We discussed his apprehension to follow-up with his referral. We explored this during the session. Therapist rationally challenge Eric Cunningham's assumptions regarding treatment and psycho-education was provided. Therapist encouraged Eric Cunningham to reconsider his stance in a short-time after upcoming transitions.   Therapist encouraged Eric Cunningham to set a timeline to reconsider psychiatric treatment to be discussed during our follow-up. Additionally, Therapist encouraged Eric Cunningham to identify his needs from this upcoming transitions and to communicate them to his wife. Therapist provided supportive therapy and encouraged self-care.   Interventions: Cognitive Behavioral Therapy and Psycho-education  Diagnosis:Episode of recurrent major depressive disorder, unspecified depression episode severity (Eric Cunningham)   Plan: Patient is to use CBT, ACT, mindfulness and coping skills to help manage decrease symptoms associated with their diagnosis. (Target Date: 2022-06-09)   Long-term goal:   Reduce overall level, frequency, and intensity of the feelings of depression & anxiety as evidenced by  decreased irritability and anger, negative self talk, and generalized worry feelings from 6 to 7 days/week to 0 to 1 days/week per client report for at least 3 consecutive months.  Short-term goal:  Verbally express understanding of the relationship between feelings of depression, anxiety and their impact on thinking patterns and behaviors. Verbalize an understanding of the role that distorted thinking plays in creating fears, excessive worry, and ruminations.  Eric Irish, LCSW

## 2022-03-29 ENCOUNTER — Encounter
Admission: RE | Admit: 2022-03-29 | Discharge: 2022-03-29 | Disposition: A | Discharge status: Home / Self Care | Payer: No Typology Code available for payment source | Source: Ambulatory Visit | Attending: Orthopedic Surgery | Admitting: Orthopedic Surgery

## 2022-03-29 NOTE — Patient Instructions (Addendum)
Your procedure is scheduled on: Thursday April 07, 2022. Report to Day Surgery inside [facility] 2nd floor, stop by registration desk before getting on elevator.  To find out your arrival time please call [phone] between 1PM - 3PM on Wednesday April 06, 2022.  Remember: Instructions that are not followed completely may result in serious medical risk,  up to and including death, or upon the discretion of your surgeon and anesthesiologist your  surgery may need to be rescheduled.     _X__ 1. Do not eat food or drink fluids after midnight the night before your procedure.                 No chewing gum or hard candies.   __X__2.  On the morning of surgery brush your teeth with toothpaste and water, you                may rinse your mouth with mouthwash if you wish.  Do not swallow any toothpaste or mouthwash.     _X__ 3.  No Alcohol for 24 hours before or after surgery.   _X__ 4.  Do Not Smoke or use e-cigarettes For 24 Hours Prior to Your Surgery.                 Do not use any chewable tobacco products for at least 6 hours prior to                 Surgery.  _X__  5.  Do not use any recreational drugs (marijuana, cocaine, heroin, ecstasy, MDMA or other)                For at least one week prior to your surgery.  Combination of these drugs with anesthesia                May have life threatening results.  ____  6.  Bring all medications with you on the day of surgery if instructed.   __X__  7.  Notify your doctor if there is any change in your medical condition      (cold, fever, infections).     Do not wear jewelry, make-up, hairpins, clips or nail polish. Do not wear lotions, powders, or perfumes. You may wear deodorant. Do not shave 48 hours prior to surgery. Men may shave face and neck. Do not bring valuables to the hospital.    [facility] is not responsible for any belongings or valuables.  Contacts, dentures or bridgework may not be worn  into surgery. Leave your suitcase in the car. After surgery it may be brought to your room. For patients admitted to the hospital, discharge time is determined by your treatment team.   Patients discharged the day of surgery will not be allowed to drive home.   Make arrangements for someone to be with you for the first 24 hours of your Same Day Discharge.  __x__ Take these medicines the morning of surgery with A SIP OF WATER:    1. None   2.   3.   4.  5.  6.  ____ Fleet Enema (as directed)   __X__ Use CHG Soap (or wipes) as directed  ____ Use Benzoyl Peroxide Gel as instructed  ____ Use inhalers on the day of surgery  ____ Stop metformin 2 days prior to surgery    ____ Take 1/2 of usual insulin dose the night before surgery. No insulin the morning  of surgery.   ____ Call your PCP, cardiologist, or Pulmonologist if taking Coumadin/Plavix/aspirin and ask when to stop before your surgery.   __X__ One Week prior to surgery- Stop Anti-inflammatories such as Ibuprofen, Aleve, Advil, Motrin, naprosyn,  meloxicam (MOBIC), diclofenac, etodolac, ketorolac, Toradol, Daypro, piroxicam, Goody's or BC powders. OK TO USE TYLENOL IF NEEDED   __X__ Stop supplements until after surgery.    ____ Bring C-Pap to the hospital.    If you have any questions regarding your pre-procedure instructions,  Please call Pre-admit Testing at [phone]

## 2022-03-30 ENCOUNTER — Other Ambulatory Visit: Payer: Self-pay | Admitting: Orthopedic Surgery

## 2022-04-05 MED ORDER — PROPOFOL 1000 MG/100ML IV EMUL
INTRAVENOUS | Status: AC
  Filled 2022-04-05: qty 200

## 2022-04-05 MED ORDER — LIDOCAINE HCL (PF) 2 % IJ SOLN
INTRAMUSCULAR | Status: AC
  Filled 2022-04-05: qty 20

## 2022-04-05 MED ORDER — EPHEDRINE 5 MG/ML INJ
INTRAVENOUS | Status: AC
  Filled 2022-04-05: qty 5

## 2022-04-05 MED ORDER — METOCLOPRAMIDE HCL 5 MG/ML IJ SOLN
INTRAMUSCULAR | Status: AC
  Filled 2022-04-05: qty 2

## 2022-04-06 MED ORDER — CHLORHEXIDINE GLUCONATE CLOTH 2 % EX PADS
6.0000 | MEDICATED_PAD | Freq: Once | CUTANEOUS | Status: AC
  Administered 2022-04-07: 6 via TOPICAL

## 2022-04-06 MED ORDER — LACTATED RINGERS IV SOLN: INTRAVENOUS | Status: DC

## 2022-04-06 MED ORDER — FAMOTIDINE 20 MG PO TABS
20.0000 mg | ORAL_TABLET | Freq: Once | ORAL | Status: AC
  Administered 2022-04-07: 20 mg via ORAL

## 2022-04-06 MED ORDER — CEFAZOLIN SODIUM-DEXTROSE 2-4 GM/100ML-% IV SOLN
2.0000 g | INTRAVENOUS | Status: AC
  Administered 2022-04-07: 2 g via INTRAVENOUS

## 2022-04-06 MED ORDER — CHLORHEXIDINE GLUCONATE 0.12 % MT SOLN
15.0000 mL | Freq: Once | OROMUCOSAL | Status: AC
  Administered 2022-04-07: 15 mL via OROMUCOSAL

## 2022-04-06 MED ORDER — CHLORHEXIDINE GLUCONATE CLOTH 2 % EX PADS: 6.0000 | MEDICATED_PAD | Freq: Once | CUTANEOUS | Status: DC

## 2022-04-06 MED ORDER — ORAL CARE MOUTH RINSE: 15.0000 mL | Freq: Once | OROMUCOSAL | Status: AC

## 2022-04-06 MED ORDER — ACETAMINOPHEN 500 MG PO TABS
1000.0000 mg | ORAL_TABLET | ORAL | Status: AC
  Administered 2022-04-07: 1000 mg via ORAL

## 2022-04-07 ENCOUNTER — Encounter
Admission: RE | Disposition: A | Discharge status: Home / Self Care | Payer: Self-pay | Source: Home / Self Care | Attending: Orthopedic Surgery

## 2022-04-07 ENCOUNTER — Encounter: Payer: Self-pay | Admitting: Orthopedic Surgery

## 2022-04-07 ENCOUNTER — Ambulatory Visit: Payer: No Typology Code available for payment source | Admitting: Anesthesiology

## 2022-04-07 ENCOUNTER — Other Ambulatory Visit: Payer: Self-pay

## 2022-04-07 ENCOUNTER — Ambulatory Visit
Admission: RE | Admit: 2022-04-07 | Discharge: 2022-04-07 | Disposition: A | Discharge status: Home / Self Care | Payer: No Typology Code available for payment source | Attending: Orthopedic Surgery | Admitting: Orthopedic Surgery

## 2022-04-07 DIAGNOSIS — D6859 Other primary thrombophilia: Secondary | ICD-10-CM

## 2022-04-07 DIAGNOSIS — M94261 Chondromalacia, right knee: Secondary | ICD-10-CM | POA: Insufficient documentation

## 2022-04-07 DIAGNOSIS — M23341 Other meniscus derangements, anterior horn of lateral meniscus, right knee: Secondary | ICD-10-CM | POA: Diagnosis not present

## 2022-04-07 DIAGNOSIS — Z87891 Personal history of nicotine dependence: Secondary | ICD-10-CM | POA: Diagnosis not present

## 2022-04-07 DIAGNOSIS — J45909 Unspecified asthma, uncomplicated: Secondary | ICD-10-CM | POA: Insufficient documentation

## 2022-04-07 DIAGNOSIS — M1711 Unilateral primary osteoarthritis, right knee: Secondary | ICD-10-CM | POA: Insufficient documentation

## 2022-04-07 DIAGNOSIS — X58XXXA Exposure to other specified factors, initial encounter: Secondary | ICD-10-CM | POA: Insufficient documentation

## 2022-04-07 DIAGNOSIS — I82409 Acute embolism and thrombosis of unspecified deep veins of unspecified lower extremity: Secondary | ICD-10-CM

## 2022-04-07 DIAGNOSIS — S83241A Other tear of medial meniscus, current injury, right knee, initial encounter: Secondary | ICD-10-CM | POA: Insufficient documentation

## 2022-04-07 DIAGNOSIS — S83281A Other tear of lateral meniscus, current injury, right knee, initial encounter: Secondary | ICD-10-CM | POA: Diagnosis present

## 2022-04-07 HISTORY — PX: KNEE ARTHROSCOPY WITH MENISCAL REPAIR: SHX5653

## 2022-04-07 LAB — GLUCOSE, CAPILLARY: Glucose-Capillary: 137 mg/dL — ABNORMAL HIGH (ref 70–99)

## 2022-04-07 SURGERY — ARTHROSCOPY, KNEE, WITH MENISCUS REPAIR
Anesthesia: General | Site: Knee | Laterality: Right

## 2022-04-07 MED ORDER — ONDANSETRON HCL 4 MG/2ML IJ SOLN
4.0000 mg | INTRAMUSCULAR | Status: AC
  Administered 2022-04-07: 4 mg via INTRAVENOUS

## 2022-04-07 MED ORDER — HYDROCODONE-ACETAMINOPHEN 5-325 MG PO TABS
1.0000 | ORAL_TABLET | ORAL | 0 refills | Status: DC | PRN
  Filled 2022-04-07: qty 30, 3d supply, fill #0

## 2022-04-07 MED ORDER — FENTANYL CITRATE (PF) 250 MCG/5ML IJ SOLN
INTRAMUSCULAR | Status: AC
  Filled 2022-04-07: qty 5

## 2022-04-07 MED ORDER — KETOROLAC TROMETHAMINE 30 MG/ML IJ SOLN
INTRAMUSCULAR | Status: AC
  Filled 2022-04-07: qty 1

## 2022-04-07 MED ORDER — PROPOFOL 10 MG/ML IV BOLUS
INTRAVENOUS | Status: AC
  Filled 2022-04-07: qty 20

## 2022-04-07 MED ORDER — LIDOCAINE HCL (PF) 1 % IJ SOLN
INTRAMUSCULAR | Status: AC
  Filled 2022-04-07: qty 30

## 2022-04-07 MED ORDER — FAMOTIDINE 20 MG PO TABS
ORAL_TABLET | ORAL | Status: AC
  Filled 2022-04-07: qty 1

## 2022-04-07 MED ORDER — HYDROMORPHONE HCL 1 MG/ML IJ SOLN
INTRAMUSCULAR | Status: AC
  Filled 2022-04-07: qty 1

## 2022-04-07 MED ORDER — PROPOFOL 10 MG/ML IV BOLUS
INTRAVENOUS | Status: DC | PRN
  Administered 2022-04-07: 200 mg via INTRAVENOUS
  Administered 2022-04-07: 170 mg via INTRAVENOUS
  Administered 2022-04-07: 50 mg via INTRAVENOUS

## 2022-04-07 MED ORDER — ONDANSETRON HCL 4 MG/2ML IJ SOLN
INTRAMUSCULAR | Status: AC
  Filled 2022-04-07: qty 2

## 2022-04-07 MED ORDER — MIDAZOLAM HCL 2 MG/2ML IJ SOLN
INTRAMUSCULAR | Status: DC | PRN
  Administered 2022-04-07: 2 mg via INTRAVENOUS

## 2022-04-07 MED ORDER — DEXAMETHASONE SODIUM PHOSPHATE 10 MG/ML IJ SOLN
INTRAMUSCULAR | Status: DC | PRN
  Administered 2022-04-07: 10 mg via INTRAVENOUS

## 2022-04-07 MED ORDER — DEXMEDETOMIDINE HCL IN NACL 80 MCG/20ML IV SOLN
INTRAVENOUS | Status: AC
  Filled 2022-04-07: qty 20

## 2022-04-07 MED ORDER — CEFAZOLIN SODIUM-DEXTROSE 2-4 GM/100ML-% IV SOLN
INTRAVENOUS | Status: AC
  Filled 2022-04-07: qty 100

## 2022-04-07 MED ORDER — LIDOCAINE HCL (CARDIAC) PF 100 MG/5ML IV SOSY
PREFILLED_SYRINGE | INTRAVENOUS | Status: DC | PRN
  Administered 2022-04-07: 100 mg via INTRAVENOUS

## 2022-04-07 MED ORDER — ALBUTEROL SULFATE (2.5 MG/3ML) 0.083% IN NEBU: 2.5000 mg | INHALATION_SOLUTION | RESPIRATORY_TRACT | Status: DC | PRN

## 2022-04-07 MED ORDER — DEXMEDETOMIDINE HCL IN NACL 200 MCG/50ML IV SOLN
INTRAVENOUS | Status: DC | PRN
  Administered 2022-04-07 (×5): 8 ug via INTRAVENOUS

## 2022-04-07 MED ORDER — KETAMINE HCL 50 MG/5ML IJ SOSY
PREFILLED_SYRINGE | INTRAMUSCULAR | Status: AC
  Filled 2022-04-07: qty 5

## 2022-04-07 MED ORDER — LIDOCAINE HCL (PF) 1 % IJ SOLN
INTRAMUSCULAR | Status: DC | PRN
  Administered 2022-04-07: 4 mL

## 2022-04-07 MED ORDER — ROCURONIUM BROMIDE 10 MG/ML (PF) SYRINGE
PREFILLED_SYRINGE | INTRAVENOUS | Status: AC
  Filled 2022-04-07: qty 10

## 2022-04-07 MED ORDER — ACETAMINOPHEN 500 MG PO TABS
ORAL_TABLET | ORAL | Status: AC
  Filled 2022-04-07: qty 2

## 2022-04-07 MED ORDER — HYDROMORPHONE HCL 1 MG/ML IJ SOLN: 0.2500 mg | INTRAMUSCULAR | Status: DC | PRN

## 2022-04-07 MED ORDER — OXYCODONE HCL 5 MG PO TABS: 5.0000 mg | ORAL_TABLET | Freq: Once | ORAL | Status: DC | PRN

## 2022-04-07 MED ORDER — KETOROLAC TROMETHAMINE 30 MG/ML IJ SOLN
INTRAMUSCULAR | Status: DC | PRN
  Administered 2022-04-07: 30 mg via INTRAVENOUS

## 2022-04-07 MED ORDER — IPRATROPIUM-ALBUTEROL 0.5-2.5 (3) MG/3ML IN SOLN
RESPIRATORY_TRACT | Status: AC
  Administered 2022-04-07: 3 mL via RESPIRATORY_TRACT
  Filled 2022-04-07: qty 3

## 2022-04-07 MED ORDER — PROPOFOL 10 MG/ML IV BOLUS
INTRAVENOUS | Status: AC
  Filled 2022-04-07: qty 40

## 2022-04-07 MED ORDER — ONDANSETRON HCL 4 MG/2ML IJ SOLN
INTRAMUSCULAR | Status: DC | PRN
  Administered 2022-04-07: 4 mg via INTRAVENOUS

## 2022-04-07 MED ORDER — MIDAZOLAM HCL 2 MG/2ML IJ SOLN
INTRAMUSCULAR | Status: AC
  Filled 2022-04-07: qty 2

## 2022-04-07 MED ORDER — LIDOCAINE HCL (PF) 2 % IJ SOLN
INTRAMUSCULAR | Status: AC
  Filled 2022-04-07: qty 5

## 2022-04-07 MED ORDER — IPRATROPIUM-ALBUTEROL 0.5-2.5 (3) MG/3ML IN SOLN: 3.0000 mL | RESPIRATORY_TRACT | Status: AC

## 2022-04-07 MED ORDER — BUPIVACAINE-EPINEPHRINE (PF) 0.25% -1:200000 IJ SOLN
INTRAMUSCULAR | Status: AC
  Filled 2022-04-07: qty 30

## 2022-04-07 MED ORDER — DEXAMETHASONE SODIUM PHOSPHATE 10 MG/ML IJ SOLN
INTRAMUSCULAR | Status: AC
  Filled 2022-04-07: qty 1

## 2022-04-07 MED ORDER — ONDANSETRON HCL 4 MG PO TABS
4.0000 mg | ORAL_TABLET | Freq: Three times a day (TID) | ORAL | 0 refills | Status: DC | PRN
  Filled 2022-04-07: qty 20, 7d supply, fill #0

## 2022-04-07 MED ORDER — RINGERS IRRIGATION IR SOLN
Status: DC | PRN
  Administered 2022-04-07: 12000 mL
  Administered 2022-04-07: 6000 mL

## 2022-04-07 MED ORDER — FENTANYL CITRATE (PF) 100 MCG/2ML IJ SOLN
INTRAMUSCULAR | Status: DC | PRN
  Administered 2022-04-07: 100 ug via INTRAVENOUS
  Administered 2022-04-07: 50 ug via INTRAVENOUS
  Administered 2022-04-07: 100 ug via INTRAVENOUS

## 2022-04-07 MED ORDER — KETAMINE HCL 10 MG/ML IJ SOLN
INTRAMUSCULAR | Status: DC | PRN
  Administered 2022-04-07: 30 mg via INTRAVENOUS
  Administered 2022-04-07: 20 mg via INTRAVENOUS

## 2022-04-07 MED ORDER — BUPIVACAINE-EPINEPHRINE (PF) 0.25% -1:200000 IJ SOLN
INTRAMUSCULAR | Status: DC | PRN
  Administered 2022-04-07: 30 mL

## 2022-04-07 MED ORDER — ENOXAPARIN SODIUM 40 MG/0.4ML IJ SOSY
40.0000 mg | PREFILLED_SYRINGE | Freq: Every day | INTRAMUSCULAR | 0 refills | Status: DC
  Filled 2022-04-07: qty 5.6, 14d supply, fill #0

## 2022-04-07 MED ORDER — OXYCODONE HCL 5 MG/5ML PO SOLN: 5.0000 mg | Freq: Once | ORAL | Status: DC | PRN

## 2022-04-07 MED ORDER — CHLORHEXIDINE GLUCONATE 0.12 % MT SOLN
OROMUCOSAL | Status: AC
  Filled 2022-04-07: qty 15

## 2022-04-07 SURGICAL SUPPLY — 41 items
ADAPTER IRRIG TUBE 2 SPIKE SOL (ADAPTER) ×2 IMPLANT
ADPR TBG 2 SPK PMP STRL ASCP (ADAPTER) ×2
BLADE FULL RADIUS 3.5 (BLADE) IMPLANT
BLADE SHAVER 4.5X7 STR FR (MISCELLANEOUS) IMPLANT
BUR BR 5.5 WIDE MOUTH (BURR) IMPLANT
CUFF TOURN SGL QUICK 24 (TOURNIQUET CUFF)
CUFF TOURN SGL QUICK 34 (TOURNIQUET CUFF)
CUFF TRNQT CYL 24X4X16.5-23 (TOURNIQUET CUFF) IMPLANT
CUFF TRNQT CYL 34X4.125X (TOURNIQUET CUFF) IMPLANT
DRAPE ARTHRO LIMB 89X125 STRL (DRAPES) ×1 IMPLANT
DRAPE IMP U-DRAPE 54X76 (DRAPES) ×1 IMPLANT
DURAPREP 26ML APPLICATOR (WOUND CARE) ×3 IMPLANT
GAUZE SPONGE 4X4 12PLY STRL (GAUZE/BANDAGES/DRESSINGS) ×1 IMPLANT
GAUZE XEROFORM 1X8 LF (GAUZE/BANDAGES/DRESSINGS) ×1 IMPLANT
GLOVE BIOGEL PI IND STRL 9 (GLOVE) ×1 IMPLANT
GLOVE BIOGEL PI ORTHO SZ9 (GLOVE) ×4 IMPLANT
GOWN STRL REUS W/ TWL LRG LVL3 (GOWN DISPOSABLE) ×1 IMPLANT
GOWN STRL REUS W/TWL 2XL LVL3 (GOWN DISPOSABLE) ×1 IMPLANT
GOWN STRL REUS W/TWL LRG LVL3 (GOWN DISPOSABLE) ×1
IV LACTATED RINGER IRRG 3000ML (IV SOLUTION) ×6
IV LR IRRIG 3000ML ARTHROMATIC (IV SOLUTION) ×6 IMPLANT
KIT TURNOVER KIT A (KITS) ×1 IMPLANT
MANIFOLD NEPTUNE II (INSTRUMENTS) ×2 IMPLANT
MAT ABSORB  FLUID 56X50 GRAY (MISCELLANEOUS) ×1
MAT ABSORB FLUID 56X50 GRAY (MISCELLANEOUS) ×1 IMPLANT
NEEDLE HYPO 22GX1.5 SAFETY (NEEDLE) ×1 IMPLANT
PACK ARTHROSCOPY KNEE (MISCELLANEOUS) ×1 IMPLANT
PAD ABD DERMACEA PRESS 5X9 (GAUZE/BANDAGES/DRESSINGS) ×2 IMPLANT
SHAVER BLADE TAPERED BLUNT 4 (BLADE) IMPLANT
SOL PREP PVP 2OZ (MISCELLANEOUS)
SOLUTION PREP PVP 2OZ (MISCELLANEOUS) ×1 IMPLANT
SPONGE T-LAP 18X18 ~~LOC~~+RFID (SPONGE) ×1 IMPLANT
STRIP CLOSURE SKIN 1/2X4 (GAUZE/BANDAGES/DRESSINGS) ×1 IMPLANT
SUT ETHILON 4-0 (SUTURE) ×1
SUT ETHILON 4-0 FS2 18XMFL BLK (SUTURE) ×1
SUTURE ETHLN 4-0 FS2 18XMF BLK (SUTURE) ×1 IMPLANT
TRAP FLUID SMOKE EVACUATOR (MISCELLANEOUS) ×1 IMPLANT
TUBING INFLOW SET DBFLO PUMP (TUBING) ×1 IMPLANT
TUBING OUTFLOW SET DBLFO PUMP (TUBING) ×1 IMPLANT
WAND WEREWOLF FLOW 90D (MISCELLANEOUS) ×1 IMPLANT
WATER STERILE IRR 500ML POUR (IV SOLUTION) ×1 IMPLANT

## 2022-04-07 NOTE — Anesthesia Postprocedure Evaluation (Signed)
Anesthesia Post Note  Patient: Eric Cunningham  Procedure(s) Performed: KNEE ARTHROSCOPY WITH PARTIAL MEDIAL AND LATERAL MENISECTOMY, CHONDROPLASTY OF LATERAL FEMORAL CHONDYL AND TROCHLEA (Right: Knee)  Patient location during evaluation: PACU Anesthesia Type: General Level of consciousness: awake and alert Pain management: pain level controlled Vital Signs Assessment: post-procedure vital signs reviewed and stable Respiratory status: spontaneous breathing, nonlabored ventilation, respiratory function stable and patient connected to nasal cannula oxygen Cardiovascular status: blood pressure returned to baseline and stable Postop Assessment: no apparent nausea or vomiting Anesthetic complications: no   No notable events documented.   Last Vitals:  Vitals:   04/07/22 1100 04/07/22 1128  BP: (!) 130/96 (!) 142/86  Pulse: 75 73  Resp: 16 14  Temp: (!) 36.4 C 36.5 C  SpO2: 98% 97%    Last Pain:  Vitals:   04/07/22 1128  TempSrc: Temporal  PainSc: 4                  Ilene Qua

## 2022-04-07 NOTE — Anesthesia Procedure Notes (Signed)
Procedure Name: LMA Insertion Date/Time: 04/07/2022 8:11 AM  Performed by: Esaw Grandchild, CRNAPre-anesthesia Checklist: Patient identified, Emergency Drugs available, Suction available and Patient being monitored Patient Re-evaluated:Patient Re-evaluated prior to induction Oxygen Delivery Method: Circle system utilized Preoxygenation: Pre-oxygenation with 100% oxygen Induction Type: IV induction LMA: LMA inserted LMA Size: 5.0 Number of attempts: 1 Placement Confirmation: positive ETCO2 and breath sounds checked- equal and bilateral Tube secured with: Tape Dental Injury: Teeth and Oropharynx as per pre-operative assessment

## 2022-04-07 NOTE — Transfer of Care (Signed)
Immediate Anesthesia Transfer of Care Note  Patient: Eric Cunningham  Procedure(s) Performed: KNEE ARTHROSCOPY WITH PARTIAL MEDIAL AND LATERAL MENISECTOMY, CHONDROPLASTY OF LATERAL FEMORAL CHONDYL AND TROCHLEA (Right: Knee)  Patient Location: PACU  Anesthesia Type:General  Level of Consciousness: drowsy  Airway & Oxygen Therapy: Patient Spontanous Breathing and Patient connected to face mask oxygen  Post-op Assessment: Report given to RN and Post -op Vital signs reviewed and stable  Post vital signs: Reviewed and stable  Last Vitals:  Vitals Value Taken Time  BP 103/57 04/07/22 0950  Temp 36.4 C 04/07/22 0950  Pulse 63 04/07/22 0956  Resp 14 04/07/22 0956  SpO2 90 % 04/07/22 0956  Vitals shown include unvalidated device data.  Last Pain:  Vitals:   04/07/22 0950  TempSrc:   PainSc: Asleep         Complications: No notable events documented.

## 2022-04-07 NOTE — H&P (Signed)
PREOPERATIVE H&P  Chief Complaint: Right Knee Lateral Meniscus Tear  HPI: Eric Cunningham is a 46 y.o. male who presents for preoperative history and physical with a diagnosis of Right Knee Lateral Meniscus Tear confirmed by MRI. Symptoms of right knee pain and mechanical symptoms are significantly impairing activities of daily living.  He has failed nonoperative management wished to proceed with a right knee arthroscopic partial lateral meniscectomy.  Past Medical History:  Diagnosis Date   Asthma    Chicken pox    DVT (deep venous thrombosis) (HCC)    History of blood transfusion    History of DVT (deep vein thrombosis)    x 2 left leg 8 years apart after long road trips   Hyperlipidemia    Past Surgical History:  Procedure Laterality Date   IVC FILTER REMOVAL N/A 06/12/2018   Procedure: IVC FILTER REMOVAL;  Surgeon: Katha Cabal, MD;  Location: [facility];  Service: Cardiovascular;  Laterality: N/A;   PERIPHERAL VASCULAR CATHETERIZATION Left 04/26/2016   Procedure: Lower Extremity Venography and thrombectomy;  Surgeon: Katha Cabal, MD;  Location: [facility];  Service: Cardiovascular;  Laterality: Left;   removal of dvt Left 2010   Social History   Socioeconomic History   Marital status: Married    Spouse name: Not on file   Number of children: Not on file   Years of education: Not on file   Highest education level: Not on file  Occupational History   Not on file  Tobacco Use   Smoking status: Former    Types: Cigars    Quit date: 06/12/2017    Years since quitting: 4.8   Smokeless tobacco: Never  Vaping Use   Vaping Use: Never used  Substance and Sexual Activity   Alcohol use: Yes    Alcohol/week: 6.0 standard drinks of alcohol    Types: 6 Cans of beer per week    Comment: weekl   Drug use: Yes    Types: Marijuana    Comment: daily   Sexual activity: Yes  Other Topics Concern   Not on file  Social History Narrative    Daughter and step child    Married wife 30, 8 (ADHD) and 3 kids as of 07/20/21    Used to be Environmental education officer    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 cousin colon cancer   Mental illness Daughter        anxiety/depression/adhd   Allergies  Allergen Reactions   Ativan [Lorazepam] Other (See Comments)    Patient states he was very anxious and very hyper.   Penicillins Other (See Comments)    Patient states his mother said he was allergic at birth. Unknown reation Has patient had a PCN reaction causing immediate rash, facial/tongue/throat swelling, SOB or lightheadedness with hypotension: Unknown Has patient had a PCN reaction causing severe rash involving mucus membranes or skin necrosis: Unknown Has patient had a PCN reaction that required hospitalization:  Unknown Has patient had a PCN reaction occurring within the last 10 years: No If all of the above answers are "NO", then may proce   Prior to Admission medications   Medication Sig Start Date End Date Taking? Authorizing Provider  cyanocobalamin (,VITAMIN B-12,) 1000 MCG/ML injection Inject 1 mL (1,000 mcg total) into the muscle every 30 (thirty) days. 07/27/21  Yes McLean-Scocuzza, Nino Glow, MD  enoxaparin (LOVENOX) 40 MG/0.4ML injection Inject 0.4 mLs (40 mg total) into the skin daily. DO NOT FILL UNTIL LATE 01/2022 01/18/22  Yes McLean-Scocuzza, Nino Glow, MD  SYRINGE-NEEDLE, DISP, 3 ML 25G X 1-1/2" 3 ML MISC 1 Device by Does not apply route every 30 (thirty) days. 07/27/21   McLean-Scocuzza, Nino Glow, MD     Positive ROS: All other systems have been reviewed  and were otherwise negative with the exception of those mentioned in the HPI and as above.  Physical Exam: General: Alert, no acute distress Cardiovascular: Regular rate and rhythm, no murmurs rubs or gallops.  No pedal edema Respiratory: Clear to auscultation bilaterally, no wheezes rales or rhonchi. No cyanosis, no use of accessory musculature GI: No organomegaly, abdomen is soft and non-tender nondistended with positive bowel sounds. Skin: Skin intact, no lesions within the operative field. Neurologic: Sensation intact distally Psychiatric: Patient is competent for consent with normal mood and affect Lymphatic: No cervical lymphadenopathy  MUSCULOSKELETAL: Right knee: The patient's skin is intact. There is no erythema, ecchymosis, or large effusion. His range of motion is from full extension to approximately 120 degrees of flexion. He has tenderness over the posterolateral joint line, but no tenderness medially. He had no ligamentous laxity to varus or valgus stress testing at 0 and 30 degrees of flexion, anterior and posterior drawer testing, and Lachman test. He had a positive McMurray test with pain laterally. Distally, he was neurovascularly intact with no calf tenderness or lower leg edema.  Assessment: Right Knee Lateral Meniscus Tear  Plan: Plan for Procedure(s): RIGHT KNEE ARTHROSCOPY WITH PARTIAL LATERAL MENISECTOMY  I reviewed the details of the operation as well as the postoperative course with the patient.  A preop history and physical was performed at the bedside this morning.  The right knee was marked according to hospital's correct site of surgery protocol, after verbally confirming with the patient that this was the correct surgical site.  I discussed the risks and benefits of surgery. The risks include but are not limited to infection, bleeding, nerve or blood vessel injury, joint stiffness or loss of motion, persistent pain, weakness or instability, retear of the  meniscus, osteoarthritis and the need for further surgery. Patient understood these risks and wished to proceed.     Thornton Park, MD   04/07/2022 8:02 AM

## 2022-04-07 NOTE — Discharge Instructions (Signed)

## 2022-04-07 NOTE — Op Note (Signed)
PATIENT:  Eric Cunningham  PRE-OPERATIVE DIAGNOSIS: Tear of lateral meniscus, right knee  POST-OPERATIVE DIAGNOSIS: Medial and lateral meniscal tears with tricompartmental osteoarthritis, right knee  PROCEDURE: Right knee arthroscopic partial medial lateral meniscectomies, chondroplasty of the lateral femoral condyle and trochlea, limited synovectomy  SURGEON:  Thornton Park, MD  ANESTHESIA:   General  PREOPERATIVE INDICATIONS:  Eric Cunningham  46 y.o. male with a diagnosis of right knee lateral meniscus tear who failed conservative management and elected for surgical management.    The risks benefits and alternatives were discussed with the patient preoperatively including the risks of infection, bleeding, nerve injury, knee stiffness, persistent pain, osteoarthritis and the need for further surgery. Medical  risks include DVT and pulmonary embolism, myocardial infarction, stroke, pneumonia, respiratory failure and death. The patient understood these risks and wished to proceed.  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 The operative extremity was signed with the word yes and my initials according the hospital's correct site of surgery protocol.  A preop history and physical was performed at the bedside.  The patient was brought to the operating room where they was placed supine on the operative table. General anesthesia was administered. The patient was prepped and draped in a sterile fashion.  A timeout was performed to verify the patient's name, date of birth, medical record  number, correct site of surgery correct procedure to be performed. It was also used to verify the patient received antibiotics that all appropriate instruments, and radiographic studies were available in the room. Once all in attendance were in agreement, the case began.  Proposed arthroscopy incisions were drawn out with a surgical marker. These were pre-injected with 1% lidocaine plain. An 11 blade was used to establish an inferior lateral and inferomedial portals. The inferomedial portal was created using a 18-gauge spinal needle under direct visualization.  A full diagnostic examination of the knee was performed including the suprapatellar pouch, patellofemoral joint, medial lateral compartments as well as the medial lateral gutters, the intercondylar notch in the posterior knee.  Findings on arthroscopy are noted above.  The medial meniscus tear was addressed first.  Patient had the medial meniscal tear treated with a Dyonics tapered shaver blade and straight duckbill basket. The meniscus was debrided until a stable rim was achieved.  The meniscus was contoured to avoid any sharp edges which may lead to further tear.    The right knee was then placed in a figure-of-four position.  Patient had extensive degeneration of the lateral meniscus with a horizontal tear stemming from the mid body to the anterior horn.  This tear was debrided using a straight duckbill basket, a left 90 degree and left curved biters and a 4.0 full-radius Dyonics shaver.    Lateral femoral condyle had tensive grade III chondromalacia of the weightbearing surface.  A chondroplasty was performed using the Dyonics tapered shaver blade.  A chondroplasty was also performed of the trochlea to remove unstable edges of a grade 3 chondral lesion.  A tricompartmental synovectomy was also performed using a Dyonics tapered shaver blade and 90 ArthroCare wand.  The knee was then copiously lavaged. All arthroscopic instruments were  removed. The 2 arthroscopy portals were closed with 4-0 nylon. Steri-Strips were applied along with a dry sterile and compressive dressing. The patient was brought to the PACU in stable condition. I was scrubbed and present for the entire case and all sharp and instrument counts were correct at the conclusion the case. I spoke with the patient's wife by phone postoperatively to let her know the case was performed without complication and the patient was stable in the recovery room.    Timoteo Gaul, MD

## 2022-04-07 NOTE — Anesthesia Preprocedure Evaluation (Addendum)
Anesthesia Evaluation  Patient identified by MRN, date of birth, ID band Patient awake    Reviewed: Allergy & Precautions, NPO status , Patient's Chart, lab work & pertinent test results  History of Anesthesia Complications Negative for: history of anesthetic complications  Airway Mallampati: II  TM Distance: >3 FB Neck ROM: Full    Dental no notable dental hx.    Pulmonary asthma , former smoker,    Pulmonary exam normal breath sounds clear to auscultation       Cardiovascular negative cardio ROS Normal cardiovascular exam Rhythm:Regular Rate:Normal     Neuro/Psych negative neurological ROS  negative psych ROS   GI/Hepatic negative GI ROS, Neg liver ROS,   Endo/Other  negative endocrine ROS  Renal/GU negative Renal ROS  negative genitourinary   Musculoskeletal negative musculoskeletal ROS (+)   Abdominal   Peds negative pediatric ROS (+)  Hematology negative hematology ROS (+) Hx of DVT x 2, takes lovenox prior to long car rides, last taken 02/27/22    Anesthesia Other Findings   Reproductive/Obstetrics negative OB ROS                            Anesthesia Physical Anesthesia Plan  ASA: 2  Anesthesia Plan: General ETT   Post-op Pain Management: Tylenol PO (pre-op)* and Toradol IV (intra-op)*   Induction: Intravenous  PONV Risk Score and Plan:   Airway Management Planned: Oral ETT  Additional Equipment:   Intra-op Plan:   Post-operative Plan: Extubation in OR  Informed Consent: I have reviewed the patients History and Physical, chart, labs and discussed the procedure including the risks, benefits and alternatives for the proposed anesthesia with the patient or authorized representative who has indicated his/her understanding and acceptance.     Dental Advisory Given  Plan Discussed with: Anesthesiologist, CRNA and Surgeon  Anesthesia Plan Comments: (Patient consented  for risks of anesthesia including but not limited to:  - adverse reactions to medications - damage to eyes, teeth, lips or other oral mucosa - nerve damage due to positioning  - sore throat or hoarseness - Damage to heart, brain, nerves, lungs, other parts of body or loss of life  Patient voiced understanding.)        Anesthesia Quick Evaluation

## 2022-04-08 ENCOUNTER — Encounter: Payer: Self-pay | Admitting: Orthopedic Surgery

## 2022-04-25 ENCOUNTER — Ambulatory Visit (INDEPENDENT_AMBULATORY_CARE_PROVIDER_SITE_OTHER): Payer: No Typology Code available for payment source | Admitting: Psychology

## 2022-04-25 DIAGNOSIS — F339 Major depressive disorder, recurrent, unspecified: Secondary | ICD-10-CM | POA: Diagnosis not present

## 2022-04-25 NOTE — Progress Notes (Signed)
[facility] Counselor/Therapist Progress Note  Patient ID: Eric Cunningham, MRN: [id number].   Date: 04/25/2022  Time Spent: 4:33 pm -5:31 pm:  58 minutes  Treatment Type: Individual Therapy  Reported Symptoms: Depression and Anxiety  Mental Status Exam: Appearance:  Neat and Well Groomed     Behavior: Appropriate  Motor: Normal  Speech/Language:  Normal Rate  Affect: Flat  Mood: dysthymic  Thought process: normal  Thought content:   WNL  Sensory/Perceptual disturbances:   WNL  Orientation: oriented to person, place, time/date, and situation  Attention: Good  Concentration: Good  Memory: [place] of knowledge:  Good  Insight:   Good  Judgment:  Good  Impulse Control: Good   Risk Assessment: Danger to Self:  No Self-injurious Behavior: No Danger to Others: No Duty to Warn:no Physical Aggression / Violence:No  Access to Firearms a concern: No  Gang Involvement:No   Subjective:   Eric Cunningham participated car, via video, and consented to treatment. Therapist participated from home office. We met online to [place]. Eric Cunningham reviewed the events of the past week. Eric Cunningham noted feeling "less wound up to tight" but feeling "more alone with it". He noted his worry about his children and his hopes that they become well developed, passionate, self-motivated, self-confident, and vocal. We explored this during the session and his attempts to engender these charateristics in how he parents. Eric Cunningham discussed his attempts to now show his emotions to his children when frustrated. We explored this and identified if there are appropriate and positive ways to communicate feelings. Therapist modeled this during the session. Therapist encouraged Eric Cunningham to engage with his children in the ways he finds to be beneficial, to promote these skills, tools, and characteristic via his own behavior, and to work on setting reasonable expectations for self as he makes  these changes. Therapist praised Social research officer, government for his effort and energy during the session and provided supportive therapy. A follow-up was scheduled for continued treatment which Eric Cunningham benefits from.   Interventions: Cognitive Behavioral Therapy and Psycho-education  Diagnosis:Episode of recurrent major depressive disorder, unspecified depression episode severity ([place])   Plan: Patient is to use CBT, ACT, mindfulness and coping skills to help manage decrease symptoms associated with their diagnosis. (Target Date: 2022-06-09)   Long-term goal:   Reduce overall level, frequency, and intensity of the feelings of depression & anxiety as evidenced by  decreased irritability and anger, negative self talk, and generalized worry feelings from 6 to 7 days/week to 0 to 1 days/week per client report for at least 3 consecutive months.  Short-term goal:  Verbally express understanding of the relationship between feelings of depression, anxiety and their impact on thinking patterns and behaviors. Verbalize an understanding of the role that distorted thinking plays in creating fears, excessive worry, and ruminations.  Eric Irish, LCSW

## 2022-05-18 ENCOUNTER — Encounter: Payer: Self-pay | Admitting: Family Medicine

## 2022-05-18 ENCOUNTER — Ambulatory Visit (INDEPENDENT_AMBULATORY_CARE_PROVIDER_SITE_OTHER): Payer: No Typology Code available for payment source | Admitting: Family Medicine

## 2022-05-18 VITALS — BP 134/84 | HR 86 | Temp 98.0°F | Ht 66.0 in | Wt 208.2 lb

## 2022-05-18 DIAGNOSIS — E538 Deficiency of other specified B group vitamins: Secondary | ICD-10-CM | POA: Diagnosis not present

## 2022-05-18 DIAGNOSIS — E559 Vitamin D deficiency, unspecified: Secondary | ICD-10-CM

## 2022-05-18 DIAGNOSIS — F1010 Alcohol abuse, uncomplicated: Secondary | ICD-10-CM

## 2022-05-18 DIAGNOSIS — F1994 Other psychoactive substance use, unspecified with psychoactive substance-induced mood disorder: Secondary | ICD-10-CM

## 2022-05-18 DIAGNOSIS — R0981 Nasal congestion: Secondary | ICD-10-CM | POA: Diagnosis not present

## 2022-05-18 DIAGNOSIS — F1210 Cannabis abuse, uncomplicated: Secondary | ICD-10-CM

## 2022-05-18 DIAGNOSIS — R7309 Other abnormal glucose: Secondary | ICD-10-CM

## 2022-05-18 DIAGNOSIS — E785 Hyperlipidemia, unspecified: Secondary | ICD-10-CM

## 2022-05-18 DIAGNOSIS — Z1329 Encounter for screening for other suspected endocrine disorder: Secondary | ICD-10-CM

## 2022-05-18 DIAGNOSIS — J069 Acute upper respiratory infection, unspecified: Secondary | ICD-10-CM | POA: Diagnosis not present

## 2022-05-18 DIAGNOSIS — F32A Depression, unspecified: Secondary | ICD-10-CM

## 2022-05-18 DIAGNOSIS — F339 Major depressive disorder, recurrent, unspecified: Secondary | ICD-10-CM

## 2022-05-18 DIAGNOSIS — Z86718 Personal history of other venous thrombosis and embolism: Secondary | ICD-10-CM

## 2022-05-18 DIAGNOSIS — D6859 Other primary thrombophilia: Secondary | ICD-10-CM

## 2022-05-18 LAB — POC COVID19 BINAXNOW: SARS Coronavirus 2 Ag: NEGATIVE

## 2022-05-18 NOTE — Progress Notes (Signed)
SUBJECTIVE:   CHIEF COMPLAINT / HPI: transfer of care  Patient presents to clinic to transfer care  Concerned for cough, sinus drainage and scratchy throat. Symptoms started yesterday.  Son has similar symptoms.  Denies any headaches, fevers, shortness of breath, chest pain, nausea/vomiting, abdominal pain, diarrhea, decrease in appetite, weight loss, loss of smell or taste.    History of DVT Patient reports that he is prescribed Lovenox only when going on long drives.    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 Abdomen soft and non-tender.  Extremities: No peripheral edema.  Neuro: Cranial nerves grossly intact     05/18/2022    2:34 PM 05/18/2022    2:15 PM 01/18/2022    3:26 PM 07/20/2021    3:53 PM 05/26/2020    3:30 PM  Depression screen PHQ 2/9  Decreased Interest '1 2 1 1 1  '$ Down, Depressed, Hopeless '1 2 1 1 2  '$ PHQ - 2 Score '2 4 2 2 3  '$ Altered sleeping 0  0 1 0  Tired, decreased energy 1  1 0 1  Change in appetite 0  1 0 1  Feeling bad or failure about yourself  0  0 1 0  Trouble concentrating 0  0 0 0  Moving slowly or fidgety/restless 0  0 0 0  Suicidal thoughts 0  0 0 0  PHQ-9 Score '3  4 4 5  '$ Difficult doing work/chores Not difficult at all  Somewhat difficult Somewhat difficult Somewhat difficult       05/18/2022    2:34 PM 01/18/2022    4:15 PM 07/20/2021    3:54 PM 05/26/2020    3:30 PM  GAD 7 : Generalized Anxiety Score  Nervous, Anxious, on Edge 1 0 0 0  Control/stop worrying 0 0 1 0  Worry  too much - different things '1 1 1 '$ 0  Trouble relaxing 0 0 1 1  Restless 0 0 1 0  Easily annoyed or irritable '1 1 2 2  '$ Afraid - awful might happen 0 '1 1 1  '$ Total GAD 7 Score '3 3 7 4  '$ Anxiety Difficulty Not difficult at all Somewhat difficult Somewhat difficult Somewhat difficult      ASSESSMENT/PLAN:   Protein S deficiency ([place]) Did not see repeat Protein S after completion of anticoagulation. Not currently on longterm anticoaguants.  Was recently prescribed Lovenox 40 mg subcutaneous s/p knee arthroplasty 04/07/22 by Orthopedics.  Unknown how long treatment needed.  -Consider repeat Protein S level after completion of Lovenox -Orthopedics to continue management current prescription of Lovenox -Follow up with PCP as scheduled   History of DVT (deep vein thrombosis) Currently taking Lovenox s/p knee arthroplasty Follow up with Orthopedics as scheduled  Episode of recurrent major depressive disorder ([place]) Follows with Behavorial health Continue current therapy  Alcohol abuse 6-8 beer week Follows with behavorial health  Marijuana abuse Daily use Follows with Behavorial health  URI  with cough and congestion In no acute respiratory distress.  Likely viral given recent contact with person who has similar symptoms, no fevers and slightly improving.   -COVID test today negative -Tylenol 325 mg every 6 hours as needed -Cepacol lozenges for relief -Honey tea daily -Stay well hydrated -Recommend annual Flu vaccine -Strict return precautions provided.    HCM Declined Flu and COVID vaccine Declined Hep C/HIV screening Colonoscopy due 2027 Tdap due 2029  Future labs ordered for upcoming annual visit scheduled 07/2022  PDMP Reviewed  Carollee Leitz, MD

## 2022-05-18 NOTE — Patient Instructions (Addendum)
It was a pleasure meeting you today. Thank you for allowing me to take part in your health care.  Our goals for today as we discussed include:  For your runny nose and scratchy throat COVID test negative -Tylenol 325 mg every 6 hours as needed -Cepacol lozenges for relief -Honey tea daily -Stay well hydrated -Recommend annual Flu vaccine  If you have worsening symptoms, especially difficulty breathing please call 911 or have someone take you to the emergency department.   We will get some labs today.  If they are abnormal or we need to do something about them, I will call you.  If they are normal, I will send you a message on MyChart (if it is active) or a letter in the mail.  If you don't hear from [place] in 2 weeks, please call the office at the number below.   Please schedule fasting lab appointment 1 week prior to next office visit  Please follow-up with PCP in 2 months  If you have any questions or concerns, please do not hesitate to call the office at [phone].  I look forward to our next visit and until then take care and stay safe.  Regards,   Carollee Leitz, MD   [facility]

## 2022-05-23 ENCOUNTER — Ambulatory Visit (INDEPENDENT_AMBULATORY_CARE_PROVIDER_SITE_OTHER): Payer: No Typology Code available for payment source | Admitting: Psychology

## 2022-05-23 DIAGNOSIS — F339 Major depressive disorder, recurrent, unspecified: Secondary | ICD-10-CM | POA: Diagnosis not present

## 2022-05-23 NOTE — Progress Notes (Unsigned)
[facility] Counselor/Therapist Progress Note  Patient ID: Eric Cunningham, MRN: [id number].   Date: 05/23/2022  Time Spent: 5:03 pm - 5:59 pm:  56 minutes  Treatment Type: Individual Therapy  Reported Symptoms: Depression and Anxiety  Mental Status Exam: Appearance:  Neat and Well Groomed     Behavior: Appropriate  Motor: Normal  Speech/Language:  Normal Rate  Affect: Flat  Mood: dysthymic  Thought process: normal  Thought content:   WNL  Sensory/Perceptual disturbances:   WNL  Orientation: oriented to person, place, time/date, and situation  Attention: Good  Concentration: Good  Memory: [place] of knowledge:  Good  Insight:   Good  Judgment:  Good  Impulse Control: Good   Risk Assessment: Danger to Self:  No Self-injurious Behavior: No Danger to Others: No Duty to Warn:no Physical Aggression / Violence:No  Access to Firearms a concern: No  Gang Involvement:No   Subjective:   Eric Cunningham participated car, via video, and consented to treatment. Therapist participated from office. We met online to [place]. Eric Cunningham reviewed the events of the past week. Eric Cunningham noted having a difficult time being emotionally there for his family as they struggle for varying reasons. Cunningham noted being more mindful of this and working on giving more support. Cunningham noted feeling unheard and being "the bad guy" when parenting. We explored this during the session. Cunningham noted attending couples counseling and being told by is couple's counselor that Cunningham is depressed and should considering medication. We explored this during the session and Eric's perception and barriers. Therapist provided psycho-education regarding psychotropic care during the session. Eric Cunningham was engaged and motivated during the session. Therapist highlighted strengths and effort for change. Therapist will provide Eric with a psychiatric referral when requested. We scheduled a follow-up for  continued treatment.   Interventions: Cognitive Behavioral Therapy and Psycho-education  Diagnosis:Episode of recurrent major depressive disorder, unspecified depression episode severity ([place])   Plan: Patient is to use CBT, ACT, mindfulness and coping skills to help manage decrease symptoms associated with their diagnosis. (Target Date: 2022-06-09)   Long-term goal:   Reduce overall level, frequency, and intensity of the feelings of depression & anxiety as evidenced by  decreased irritability and anger, negative self talk, and generalized worry feelings from 6 to 7 days/week to 0 to 1 days/week per client report for at least 3 consecutive months.  Short-term goal:  Verbally express understanding of the relationship between feelings of depression, anxiety and their impact on thinking patterns and behaviors. Verbalize an understanding of the role that distorted thinking plays in creating fears, excessive worry, and ruminations.  Bu[place] Irish, LCSW

## 2022-05-29 ENCOUNTER — Encounter: Payer: Self-pay | Admitting: Family Medicine

## 2022-05-29 DIAGNOSIS — J069 Acute upper respiratory infection, unspecified: Secondary | ICD-10-CM | POA: Insufficient documentation

## 2022-05-29 NOTE — Assessment & Plan Note (Signed)
Follows with Behavorial health Continue current therapy

## 2022-05-29 NOTE — Assessment & Plan Note (Signed)
Did not see repeat Protein S after completion of anticoagulation. Not currently on longterm anticoaguants.  Was recently prescribed Lovenox 40 mg subcutaneous s/p knee arthroplasty 04/07/22 by Orthopedics.  Unknown how long treatment needed.  -Consider repeat Protein S level after completion of Lovenox -Orthopedics to continue management current prescription of Lovenox -Follow up with PCP as scheduled

## 2022-05-29 NOTE — Assessment & Plan Note (Signed)
6-8 beer week Follows with behavorial health

## 2022-05-29 NOTE — Assessment & Plan Note (Signed)
Currently taking Lovenox s/p knee arthroplasty Follow up with Orthopedics as scheduled

## 2022-05-29 NOTE — Assessment & Plan Note (Signed)
In no acute respiratory distress.  Likely viral given recent contact with person who has similar symptoms, no fevers and slightly improving.   -COVID test today negative -Tylenol 325 mg every 6 hours as needed -Cepacol lozenges for relief -Honey tea daily -Stay well hydrated -Recommend annual Flu vaccine -Strict return precautions provided.

## 2022-05-29 NOTE — Assessment & Plan Note (Signed)
Daily use Follows with [employer] [facility]

## 2022-06-28 ENCOUNTER — Ambulatory Visit (INDEPENDENT_AMBULATORY_CARE_PROVIDER_SITE_OTHER): Payer: No Typology Code available for payment source | Admitting: Psychology

## 2022-06-28 DIAGNOSIS — F339 Major depressive disorder, recurrent, unspecified: Secondary | ICD-10-CM

## 2022-06-28 NOTE — Progress Notes (Signed)
[place] Counselor/Therapist Progress Note  Patient ID: Eric Cunningham, MRN: [id number].   Date: 06/28/2022  Time Spent: 5:06 pm - 5:58 pm:  52 minutes  Treatment Type: Individual Therapy  Reported Symptoms: Depression and Anxiety  Mental Status Exam: Appearance:  Neat and Well Groomed     Behavior: Appropriate  Motor: Normal  Speech/Language:  Normal Rate  Affect: Flat  Mood: dysthymic  Thought process: normal  Thought content:   WNL  Sensory/Perceptual disturbances:   WNL  Orientation: oriented to person, place, time/date, and situation  Attention: Good  Concentration: Good  Memory: [place] of knowledge:  Good  Insight:   Good  Judgment:  Good  Impulse Control: Good   Risk Assessment: Danger to Self:  No Self-injurious Behavior: No Danger to Others: No Duty to Warn:no Physical Aggression / Violence:No  Access to Firearms a concern: No  Gang Involvement:No   Subjective:   Eric Cunningham participated car, via video, and consented to treatment. Therapist participated from office. We met online to [place]. Eric Cunningham reviewed the events of the past week. He noted feeling disconnected from his wife and needing more closeness and to be more empathetic towards her. We process this during the session and his difficulty communicating support and empathy in a comfortable and genuine manner. We identified possible "over-thinking" as a barrier to progress. We explored this during the session. Eric Cunningham discussed wanting to improve his communication at home and manage his distress more consistently. We wise-mind, a DBT skill, and handouts were provided via email. Additionally, we discussed radical acceptance and the use of reflective listening, provided via email. Therapist modeled this and validated Eric Cunningham's feelings and experience. We discussed the importance of boundaries for self while acknowledging frustrations and needs. Therapist encouraged  Eric Cunningham to continue with treatment and to call in for additional sessions. Therapist provided supportive therapy.   Interventions: Cognitive Behavioral Therapy and Psycho-education  Diagnosis:Episode of recurrent major depressive disorder, unspecified depression episode severity ([place])  Other depression   Plan: Patient is to use CBT, ACT, mindfulness and coping skills to help manage decrease symptoms associated with their diagnosis. (Target Date: 2022-07-09)   Long-term goal:   Reduce overall level, frequency, and intensity of the feelings of depression & anxiety as evidenced by  decreased irritability and anger, negative self talk, and generalized worry feelings from 6 to 7 days/week to 0 to 1 days/week per client report for at least 3 consecutive months.  Short-term goal:  Verbally express understanding of the relationship between feelings of depression, anxiety and their impact on thinking patterns and behaviors. Verbalize an understanding of the role that distorted thinking plays in creating fears, excessive worry, and ruminations.  Buena Irish, LCSW

## 2022-07-07 ENCOUNTER — Telehealth: Payer: Self-pay | Admitting: Family Medicine

## 2022-07-07 ENCOUNTER — Other Ambulatory Visit: Payer: Self-pay

## 2022-07-07 DIAGNOSIS — I82409 Acute embolism and thrombosis of unspecified deep veins of unspecified lower extremity: Secondary | ICD-10-CM

## 2022-07-07 DIAGNOSIS — D6859 Other primary thrombophilia: Secondary | ICD-10-CM

## 2022-07-07 MED ORDER — ENOXAPARIN SODIUM 40 MG/0.4ML IJ SOSY
40.0000 mg | PREFILLED_SYRINGE | Freq: Every day | INTRAMUSCULAR | 2 refills | Status: DC
  Filled 2022-07-07: qty 10, 25d supply, fill #0
  Filled 2022-12-26: qty 2.4, 6d supply, fill #1

## 2022-07-07 NOTE — Telephone Encounter (Signed)
Pt wife called stating pt need a refill on enoxaparin sent to [facility]

## 2022-07-07 NOTE — Telephone Encounter (Signed)
I called the patients wife and I informed her that the RX was sent to the pharmacy.  Jayliah Benett,cma

## 2022-07-08 ENCOUNTER — Other Ambulatory Visit: Payer: Self-pay

## 2022-07-20 ENCOUNTER — Encounter: Payer: No Typology Code available for payment source | Admitting: Family Medicine

## 2022-07-20 ENCOUNTER — Other Ambulatory Visit: Payer: Self-pay

## 2022-07-20 ENCOUNTER — Other Ambulatory Visit (INDEPENDENT_AMBULATORY_CARE_PROVIDER_SITE_OTHER): Payer: No Typology Code available for payment source

## 2022-07-20 DIAGNOSIS — Z1329 Encounter for screening for other suspected endocrine disorder: Secondary | ICD-10-CM

## 2022-07-20 DIAGNOSIS — F1010 Alcohol abuse, uncomplicated: Secondary | ICD-10-CM

## 2022-07-20 DIAGNOSIS — E538 Deficiency of other specified B group vitamins: Secondary | ICD-10-CM

## 2022-07-20 DIAGNOSIS — R7309 Other abnormal glucose: Secondary | ICD-10-CM | POA: Diagnosis not present

## 2022-07-20 DIAGNOSIS — E785 Hyperlipidemia, unspecified: Secondary | ICD-10-CM

## 2022-07-20 DIAGNOSIS — E559 Vitamin D deficiency, unspecified: Secondary | ICD-10-CM | POA: Diagnosis not present

## 2022-07-20 LAB — LIPID PANEL
Cholesterol: 283 mg/dL — ABNORMAL HIGH (ref 0–200)
HDL: 66.7 mg/dL (ref >39.00)
LDL Cholesterol: 190 mg/dL — ABNORMAL HIGH (ref 0–99)
NonHDL: 215.95
Total CHOL/HDL Ratio: 4
Triglycerides: 128 mg/dL (ref 0.0–149.0)
VLDL: 25.6 mg/dL (ref 0.0–40.0)

## 2022-07-20 LAB — VITAMIN B12: Vitamin B-12: 353 pg/mL (ref 211–911)

## 2022-07-20 LAB — CBC WITH DIFFERENTIAL/PLATELET
Basophils Absolute: 0.1 10*3/uL (ref 0.0–0.1)
Basophils Relative: 0.8 % (ref 0.0–3.0)
Eosinophils Absolute: 0.2 10*3/uL (ref 0.0–0.7)
Eosinophils Relative: 3.2 % (ref 0.0–5.0)
HCT: 45.8 % (ref 39.0–52.0)
Hemoglobin: 15.2 g/dL (ref 13.0–17.0)
Lymphocytes Relative: 25.7 % (ref 12.0–46.0)
Lymphs Abs: 1.6 10*3/uL (ref 0.7–4.0)
MCHC: 33.2 g/dL (ref 30.0–36.0)
MCV: 90 fl (ref 78.0–100.0)
Monocytes Absolute: 0.6 10*3/uL (ref 0.1–1.0)
Monocytes Relative: 9.6 % (ref 3.0–12.0)
Neutro Abs: 3.8 10*3/uL (ref 1.4–7.7)
Neutrophils Relative %: 60.7 % (ref 43.0–77.0)
Platelets: 302 10*3/uL (ref 150.0–400.0)
RBC: 5.09 Mil/uL (ref 4.22–5.81)
RDW: 13.8 % (ref 11.5–15.5)
WBC: 6.3 10*3/uL (ref 4.0–10.5)

## 2022-07-20 LAB — VITAMIN D 25 HYDROXY (VIT D DEFICIENCY, FRACTURES): VITD: 21.9 ng/mL — ABNORMAL LOW (ref 30.00–100.00)

## 2022-07-20 LAB — COMPREHENSIVE METABOLIC PANEL
ALT: 18 U/L (ref 0–53)
AST: 14 U/L (ref 0–37)
Albumin: 4.4 g/dL (ref 3.5–5.2)
Alkaline Phosphatase: 69 U/L (ref 39–117)
BUN: 21 mg/dL (ref 6–23)
CO2: 26 mEq/L (ref 19–32)
Calcium: 9.2 mg/dL (ref 8.4–10.5)
Chloride: 103 mEq/L (ref 96–112)
Creatinine, Ser: 0.84 mg/dL (ref 0.40–1.50)
GFR: 103.5 mL/min (ref >60.00)
Glucose, Bld: 102 mg/dL — ABNORMAL HIGH (ref 70–99)
Potassium: 4.7 mEq/L (ref 3.5–5.1)
Sodium: 137 mEq/L (ref 135–145)
Total Bilirubin: 0.4 mg/dL (ref 0.2–1.2)
Total Protein: 7.3 g/dL (ref 6.0–8.3)

## 2022-07-20 LAB — TSH: TSH: 1.7 u[IU]/mL (ref 0.35–5.50)

## 2022-07-20 LAB — HEMOGLOBIN A1C: Hgb A1c MFr Bld: 5.4 % (ref 4.6–6.5)

## 2022-07-20 MED ORDER — ROSUVASTATIN CALCIUM 5 MG PO TABS
5.0000 mg | ORAL_TABLET | Freq: Every day | ORAL | 3 refills | Status: DC
  Filled 2022-07-20 – 2022-08-08 (×2): qty 90, 90d supply, fill #0
  Filled 2022-12-14: qty 90, 90d supply, fill #1
  Filled 2023-06-13: qty 90, 90d supply, fill #2

## 2022-07-20 MED ORDER — VITAMIN D (ERGOCALCIFEROL) 1.25 MG (50000 UNIT) PO CAPS
50000.0000 [IU] | ORAL_CAPSULE | ORAL | 0 refills | Status: DC
  Filled 2022-07-20 – 2022-08-08 (×2): qty 12, 84d supply, fill #0

## 2022-07-20 NOTE — Progress Notes (Signed)
noted 

## 2022-07-20 NOTE — Addendum Note (Signed)
Addended by: Neta Ehlers on: 07/20/2022 08:19 AM   Modules accepted: Orders

## 2022-07-21 ENCOUNTER — Other Ambulatory Visit: Payer: No Typology Code available for payment source

## 2022-07-21 LAB — URINALYSIS, ROUTINE W REFLEX MICROSCOPIC
Bilirubin Urine: NEGATIVE
Hgb urine dipstick: NEGATIVE
Ketones, ur: NEGATIVE
Leukocytes,Ua: NEGATIVE
Nitrite: NEGATIVE
RBC / HPF: NONE SEEN (ref 0–?)
Specific Gravity, Urine: >=1.03 — AB (ref 1.000–1.030)
Total Protein, Urine: NEGATIVE
Urine Glucose: NEGATIVE
Urobilinogen, UA: 0.2 (ref 0.0–1.0)
WBC, UA: NONE SEEN (ref 0–?)
pH: 5.5 (ref 5.0–8.0)

## 2022-07-27 ENCOUNTER — Ambulatory Visit (INDEPENDENT_AMBULATORY_CARE_PROVIDER_SITE_OTHER): Payer: 59 | Admitting: Family Medicine

## 2022-07-27 ENCOUNTER — Encounter: Payer: Self-pay | Admitting: Family Medicine

## 2022-07-27 VITALS — BP 138/84 | HR 88 | Temp 97.6°F | Ht 66.0 in | Wt 211.0 lb

## 2022-07-27 DIAGNOSIS — R7309 Other abnormal glucose: Secondary | ICD-10-CM

## 2022-07-27 DIAGNOSIS — Z0000 Encounter for general adult medical examination without abnormal findings: Secondary | ICD-10-CM | POA: Diagnosis not present

## 2022-07-27 DIAGNOSIS — E785 Hyperlipidemia, unspecified: Secondary | ICD-10-CM

## 2022-07-27 DIAGNOSIS — E538 Deficiency of other specified B group vitamins: Secondary | ICD-10-CM

## 2022-07-27 DIAGNOSIS — R030 Elevated blood-pressure reading, without diagnosis of hypertension: Secondary | ICD-10-CM | POA: Diagnosis not present

## 2022-07-27 DIAGNOSIS — E559 Vitamin D deficiency, unspecified: Secondary | ICD-10-CM

## 2022-07-27 DIAGNOSIS — Z86718 Personal history of other venous thrombosis and embolism: Secondary | ICD-10-CM

## 2022-07-27 DIAGNOSIS — F1994 Other psychoactive substance use, unspecified with psychoactive substance-induced mood disorder: Secondary | ICD-10-CM

## 2022-07-27 NOTE — Patient Instructions (Signed)
It was a pleasure meeting you today. Thank you for allowing me to take part in your health care.  Our goals for today as we discussed include:  Please schedule annual visit for 1 year with a lab visit 1 week before visit  Blood pressure initially elevated.   Will continue to monitor at next visit. Limit salt intake and increase acitivity  If you have any questions or concerns, please do not hesitate to call the office at [phone].  I look forward to our next visit and until then take care and stay safe.  Regards,   Carollee Leitz, MD   [facility]

## 2022-07-27 NOTE — Progress Notes (Signed)
   SUBJECTIVE:   Chief Complaint  Patient presents with   Annual Exam   HPI Patient presents to clinic for annual physical exam  No acute concerns today  Reviewed recent labs with patient. Vitamin D levels low Elevated cholesterol    PERTINENT PMH / PSH: History of DVT Mood disorder Hyperlipidemia  OBJECTIVE:  BP 138/84   Pulse 88   Temp 97.6 F (36.4 C)   Ht '5\' 6"'$  (1.676 m)   Wt 211 lb (95.7 kg)   SpO2 98%   BMI 34.06 kg/m    Physical Exam Vitals reviewed.  HENT:     Head: Normocephalic.     Right Ear: Tympanic membrane, ear canal and external ear normal.     Left Ear: Tympanic membrane, ear canal and external ear normal.     Nose: Nose normal.     Mouth/Throat:     Mouth: Mucous membranes are moist.  Eyes:     Conjunctiva/sclera: Conjunctivae normal.     Pupils: Pupils are equal, round, and reactive to light.  Neck:     Thyroid: No thyromegaly or thyroid tenderness.     Vascular: No carotid bruit.  Cardiovascular:     Rate and Rhythm: Normal rate and regular rhythm.     Pulses: Normal pulses.     Heart sounds: Normal heart sounds.  Pulmonary:     Effort: Pulmonary effort is normal.     Breath sounds: Normal breath sounds.  Abdominal:     General: Abdomen is flat. Bowel sounds are normal.     Palpations: Abdomen is soft.  Musculoskeletal:        General: Normal range of motion.     Cervical back: Normal range of motion and neck supple.     Right lower leg: No edema.     Left lower leg: No edema.  Lymphadenopathy:     Cervical: No cervical adenopathy.  Neurological:     General: No focal deficit present.     Mental Status: He is alert and oriented to person, place, and time. Mental status is at baseline.  Psychiatric:        Mood and Affect: Mood normal.        Behavior: Behavior normal.        Thought Content: Thought content normal.        Judgment: Judgment normal.     ASSESSMENT/PLAN:  Encounter for routine history and physical exam  for male Assessment & Plan: Doing well. Discussed healthy diet and increased exercise to help lower cholesterol levels Declined flu vaccine Declined HIV/hep C screening Blood pressure well-controlled without antihypertensives Colonoscopy up-to-date Follow-up in 1 year for repeat annual physical and blood work    Hyperlipidemia, unspecified hyperlipidemia type -     Lipid panel; Future  History of DVT (deep vein thrombosis) -     CBC with Differential/Platelet; Future  B12 deficiency -     Vitamin B12; Future  Vitamin D deficiency -     VITAMIN D 25 Hydroxy (Vit-D Deficiency, Fractures); Future  Elevated blood pressure reading -     Comprehensive metabolic panel; Future -     TSH; Future  Abnormal glucose -     Hemoglobin A1c; Future   PDMP reviewed  Return for annual visit with fasting labs 1 week prior, PCP.  Carollee Leitz, MD

## 2022-08-02 ENCOUNTER — Other Ambulatory Visit: Payer: Self-pay

## 2022-08-07 ENCOUNTER — Encounter: Payer: Self-pay | Admitting: Family Medicine

## 2022-08-07 DIAGNOSIS — R030 Elevated blood-pressure reading, without diagnosis of hypertension: Secondary | ICD-10-CM | POA: Insufficient documentation

## 2022-08-07 DIAGNOSIS — R7309 Other abnormal glucose: Secondary | ICD-10-CM | POA: Insufficient documentation

## 2022-08-07 NOTE — Assessment & Plan Note (Addendum)
Doing well. Discussed healthy diet and increased exercise to help lower cholesterol levels Declined flu vaccine Declined HIV/hep C screening Blood pressure well-controlled without antihypertensives Colonoscopy up-to-date Follow-up in 1 year for repeat annual physical and blood work

## 2022-08-08 ENCOUNTER — Other Ambulatory Visit: Payer: Self-pay

## 2022-10-31 ENCOUNTER — Other Ambulatory Visit: Payer: Self-pay

## 2022-10-31 ENCOUNTER — Other Ambulatory Visit: Payer: Self-pay | Admitting: Family Medicine

## 2022-11-01 MED FILL — Ergocalciferol Cap 1.25 MG (50000 Unit): ORAL | 84 days supply | Qty: 12 | Fill #0 | Status: CN

## 2022-11-02 ENCOUNTER — Other Ambulatory Visit: Payer: Self-pay

## 2022-11-04 ENCOUNTER — Other Ambulatory Visit: Payer: Self-pay

## 2022-11-15 ENCOUNTER — Other Ambulatory Visit: Payer: Self-pay

## 2022-12-14 ENCOUNTER — Other Ambulatory Visit: Payer: Self-pay

## 2022-12-14 MED FILL — Ergocalciferol Cap 1.25 MG (50000 Unit): ORAL | 84 days supply | Qty: 12 | Fill #0 | Status: AC

## 2022-12-26 ENCOUNTER — Other Ambulatory Visit: Payer: Self-pay

## 2022-12-27 ENCOUNTER — Other Ambulatory Visit: Payer: Self-pay

## 2023-02-17 ENCOUNTER — Ambulatory Visit: Payer: 59 | Admitting: Nurse Practitioner

## 2023-06-16 ENCOUNTER — Other Ambulatory Visit: Payer: Self-pay

## 2024-06-11 NOTE — Telephone Encounter (Signed)
 open in error

## 2024-06-27 ENCOUNTER — Other Ambulatory Visit: Payer: Self-pay

## 2024-06-27 ENCOUNTER — Ambulatory Visit: Admitting: Family

## 2024-06-27 ENCOUNTER — Encounter: Payer: Self-pay | Admitting: Family

## 2024-06-27 VITALS — BP 136/84 | HR 82 | Temp 97.8°F | Ht 66.0 in | Wt 204.4 lb

## 2024-06-27 DIAGNOSIS — Z86718 Personal history of other venous thrombosis and embolism: Secondary | ICD-10-CM

## 2024-06-27 DIAGNOSIS — I82409 Acute embolism and thrombosis of unspecified deep veins of unspecified lower extremity: Secondary | ICD-10-CM | POA: Diagnosis not present

## 2024-06-27 DIAGNOSIS — E785 Hyperlipidemia, unspecified: Secondary | ICD-10-CM | POA: Diagnosis not present

## 2024-06-27 DIAGNOSIS — Z125 Encounter for screening for malignant neoplasm of prostate: Secondary | ICD-10-CM

## 2024-06-27 DIAGNOSIS — D6859 Other primary thrombophilia: Secondary | ICD-10-CM | POA: Diagnosis not present

## 2024-06-27 DIAGNOSIS — E538 Deficiency of other specified B group vitamins: Secondary | ICD-10-CM | POA: Diagnosis not present

## 2024-06-27 DIAGNOSIS — E559 Vitamin D deficiency, unspecified: Secondary | ICD-10-CM

## 2024-06-27 LAB — CBC WITH DIFFERENTIAL/PLATELET
Basophils Absolute: 0.1 K/uL (ref 0.0–0.1)
Basophils Relative: 0.8 % (ref 0.0–3.0)
Eosinophils Absolute: 0.1 K/uL (ref 0.0–0.7)
Eosinophils Relative: 1 % (ref 0.0–5.0)
HCT: 43.6 % (ref 39.0–52.0)
Hemoglobin: 14.7 g/dL (ref 13.0–17.0)
Lymphocytes Relative: 21.5 % (ref 12.0–46.0)
Lymphs Abs: 1.6 K/uL (ref 0.7–4.0)
MCHC: 33.6 g/dL (ref 30.0–36.0)
MCV: 88.9 fl (ref 78.0–100.0)
Monocytes Absolute: 0.6 K/uL (ref 0.1–1.0)
Monocytes Relative: 7.4 % (ref 3.0–12.0)
Neutro Abs: 5.2 K/uL (ref 1.4–7.7)
Neutrophils Relative %: 69.3 % (ref 43.0–77.0)
Platelets: 353 K/uL (ref 150.0–400.0)
RBC: 4.91 Mil/uL (ref 4.22–5.81)
RDW: 14.1 % (ref 11.5–15.5)
WBC: 7.5 K/uL (ref 4.0–10.5)

## 2024-06-27 LAB — COMPREHENSIVE METABOLIC PANEL WITH GFR
ALT: 16 U/L (ref 0–53)
AST: 18 U/L (ref 0–37)
Albumin: 4.7 g/dL (ref 3.5–5.2)
Alkaline Phosphatase: 61 U/L (ref 39–117)
BUN: 16 mg/dL (ref 6–23)
CO2: 26 meq/L (ref 19–32)
Calcium: 9.3 mg/dL (ref 8.4–10.5)
Chloride: 100 meq/L (ref 96–112)
Creatinine, Ser: 0.77 mg/dL (ref 0.40–1.50)
GFR: 104.82 mL/min (ref >60.00)
Glucose, Bld: 89 mg/dL (ref 70–99)
Potassium: 4.5 meq/L (ref 3.5–5.1)
Sodium: 136 meq/L (ref 135–145)
Total Bilirubin: 0.4 mg/dL (ref 0.2–1.2)
Total Protein: 7.2 g/dL (ref 6.0–8.3)

## 2024-06-27 LAB — LIPID PANEL
Cholesterol: 235 mg/dL — ABNORMAL HIGH (ref 0–200)
HDL: 78.5 mg/dL (ref >39.00)
LDL Cholesterol: 144 mg/dL — ABNORMAL HIGH (ref 0–99)
NonHDL: 156.45
Total CHOL/HDL Ratio: 3
Triglycerides: 62 mg/dL (ref 0.0–149.0)
VLDL: 12.4 mg/dL (ref 0.0–40.0)

## 2024-06-27 LAB — B12 AND FOLATE PANEL
Folate: 19.8 ng/mL (ref >5.9)
Vitamin B-12: 180 pg/mL — ABNORMAL LOW (ref 211–911)

## 2024-06-27 LAB — TSH: TSH: 1.03 u[IU]/mL (ref 0.35–5.50)

## 2024-06-27 LAB — VITAMIN D 25 HYDROXY (VIT D DEFICIENCY, FRACTURES): VITD: 16.72 ng/mL — ABNORMAL LOW (ref 30.00–100.00)

## 2024-06-27 LAB — PSA: PSA: 0.7 ng/mL (ref 0.10–4.00)

## 2024-06-27 MED ORDER — ENOXAPARIN SODIUM 40 MG/0.4ML IJ SOSY
40.0000 mg | PREFILLED_SYRINGE | Freq: Every day | INTRAMUSCULAR | 2 refills | Status: AC
  Filled 2024-06-27: qty 10, 25d supply, fill #0

## 2024-06-27 MED ORDER — ROSUVASTATIN CALCIUM 5 MG PO TABS
5.0000 mg | ORAL_TABLET | Freq: Every day | ORAL | 3 refills | Status: AC
  Filled 2024-06-27: qty 90, 90d supply, fill #0

## 2024-06-27 NOTE — Assessment & Plan Note (Addendum)
 Discussed goal of LDL being less than 100 to decrease ASCVD risk.  Pending lipid panel.  Refilled Crestor  5 mg for now.

## 2024-06-27 NOTE — Assessment & Plan Note (Signed)
 History of protein S deficiency.  Rechecking today. Refilled Lovenox  40 mg prophylactic dose for travel.  He is aware the importance of prophylaxis

## 2024-06-27 NOTE — Progress Notes (Signed)
 Assessment & Plan:  B12 deficiency -     TSH -     B12 and Folate Panel  Protein S deficiency -     Enoxaparin  Sodium; Inject 0.4 mLs (40 mg total) into the skin daily. Use for travel  Dispense: 10 mL; Refill: 2  Recurrent acute deep vein thrombosis (DVT) of lower extremity, unspecified laterality (HCC) -     Protein S activity -     Enoxaparin  Sodium; Inject 0.4 mLs (40 mg total) into the skin daily. Use for travel  Dispense: 10 mL; Refill: 2  Screening for prostate cancer -     PSA  Hyperlipidemia, unspecified hyperlipidemia type Assessment & Plan: Discussed goal of LDL being less than 100 to decrease ASCVD risk.  Pending lipid panel.  Refilled Crestor  5 mg for now.   Orders: -     CBC with Differential/Platelet -     Comprehensive metabolic panel with GFR -     Lipid panel  Vitamin D  deficiency -     VITAMIN D  25 Hydroxy (Vit-D Deficiency, Fractures)  History of DVT (deep vein thrombosis) Assessment & Plan: History of protein S deficiency.  Rechecking today. Refilled Lovenox  40 mg prophylactic dose for travel.  He is aware the importance of prophylaxis   Other orders -     Rosuvastatin  Calcium ; Take 1 tablet (5 mg total) by mouth daily.  Dispense: 90 tablet; Refill: 3     Return precautions given.   Risks, benefits, and alternatives of the medications and treatment plan prescribed today were discussed, and patient expressed understanding.   Education regarding symptom management and diagnosis given to patient on AVS either electronically or printed.  No follow-ups on file.  Rollene Northern, FNP  Subjective:    Patient ID: Tita DELENA Anderson, male    DOB: January 01, 1976, 48 y.o.   MRN: [id number]  CC: OBI SCRIMA is a 48 y.o. male who presents today for follow up/TOC.   HPI: HPI Discussed the use of AI scribe software for clinical note transcription with the patient, who gave verbal consent to proceed.  History of Present Illness   BRIYAN KLEVEN is a 48 year old male who presents for follow-up on anticoagulation management and cholesterol monitoring.  He has a history of two deep vein thromboses (DVTs) in his left leg, first occurring in 2013 and the second in 2017, both following long car rides.    He completed a course of Eliquis  after the second DVT. He currently uses Lovenox  prophylactically before long travels, such as car rides to [place]  or flights, and keeps a supply at home.  He has been monitored for cholesterol issues, previously managed by Dr. Hope. He has been prescribed Crestor  but uses it inconsistently. His LDL levels have not dropped below 100.  Family history is significant for his father having multiple strokes and dementia, and his mother having a clotting disorder and dementia. He also reports a family history of cancer, including a cousin with colon cancer and an uncle with liver cancer that metastasized to the colon. He had a colonoscopy in 2022, which found a precancerous polyp, and he is due for a repeat in 2027.  Socially, he was born in [place] to [place] parents. He vapes marijuana daily and consumes alcohol socially. He denies smoking cigarettes or cigars.  he reports episodic increased urinary frequency, particularly after consuming caffeine. Denies urinary hesitancy or decreased urinary stream.      Protein S not obtained in  05/2022 Protein S low 5, 05/2015 Last seen hematology 05/17/2016, Dr.  Jacobo for acute DVT left lower extremity First DVT left LLE in [place] in 2012, second left DVT 2017; both occurred after long car ride; completed eliquis  in 2017. Uses lovenox  prn for travel prn  Colonoscopy Dr. Teressa, 09/23/2020; repeat in 5 years  Allergies: Ativan [lorazepam] and Penicillins Current Outpatient Medications on File Prior to Visit  Medication Sig Dispense Refill   cyanocobalamin  (VITAMIN B12) 1000 MCG/ML injection Inject 1 mL (1,000 mcg total) into the muscle every 30 (thirty) days. 12 mL 1    SYRINGE-NEEDLE, DISP, 3 ML 25G X 1-1/2 3 ML MISC 1 Device by Does not apply route every 30 (thirty) days. 12 each 1   Vitamin D , Ergocalciferol , (DRISDOL ) 1.25 MG (50000 UNIT) CAPS capsule Take 1 capsule (50,000 Units total) by mouth every 7 (seven) days. 12 capsule 0   No current facility-administered medications on file prior to visit.    Review of Systems  Constitutional:  Negative for chills and fever.  Respiratory:  Negative for cough.   Cardiovascular:  Negative for chest pain and palpitations.  Gastrointestinal:  Negative for nausea and vomiting.      Objective:    BP 136/84   Pulse 82   Temp 97.8 F (36.6 C) (Oral)   Ht 5' 6 (1.676 m)   Wt 204 lb 6.4 oz (92.7 kg)   SpO2 98%   BMI 32.99 kg/m  BP Readings from Last 3 Encounters:  06/27/24 136/84  07/27/22 138/84  05/18/22 134/84   Wt Readings from Last 3 Encounters:  06/27/24 204 lb 6.4 oz (92.7 kg)  07/27/22 211 lb (95.7 kg)  05/18/22 208 lb 3.2 oz (94.4 kg)      06/27/2024    1:41 PM 07/27/2022    1:59 PM 05/18/2022    2:34 PM  Depression screen PHQ 2/9  Decreased Interest 0 1 1  Down, Depressed, Hopeless 0 1 1  PHQ - 2 Score 0 2 2  Altered sleeping  1 0  Tired, decreased energy  0 1  Change in appetite  0 0  Feeling bad or failure about yourself   1 0  Trouble concentrating  0 0  Moving slowly or fidgety/restless  0 0  Suicidal thoughts  0 0  PHQ-9 Score  4  3   Difficult doing work/chores  Somewhat difficult Not difficult at all     Data saved with a previous flowsheet row definition     Physical Exam Vitals reviewed.  Constitutional:      Appearance: He is well-developed.  Neck:     Thyroid : No thyroid  mass or thyromegaly.  Cardiovascular:     Rate and Rhythm: Regular rhythm.     Heart sounds: Normal heart sounds.  Pulmonary:     Effort: Pulmonary effort is normal. No respiratory distress.     Breath sounds: Normal breath sounds. No wheezing, rhonchi or rales.  Lymphadenopathy:      Head:     Right side of head: No submental, submandibular, tonsillar, preauricular, posterior auricular or occipital adenopathy.     Left side of head: No submental, submandibular, tonsillar, preauricular, posterior auricular or occipital adenopathy.     Cervical: No cervical adenopathy.  Skin:    General: Skin is warm and dry.  Neurological:     Mental Status: He is alert.  Psychiatric:        Speech: Speech normal.        Behavior:  Behavior normal.

## 2024-06-29 LAB — PROTEIN S ACTIVITY: Protein S Activity: 23 %{normal} — ABNORMAL LOW (ref 70–150)

## 2024-07-03 ENCOUNTER — Other Ambulatory Visit: Payer: Self-pay

## 2024-07-07 ENCOUNTER — Ambulatory Visit: Payer: Self-pay | Admitting: Family

## 2024-07-07 DIAGNOSIS — D6859 Other primary thrombophilia: Secondary | ICD-10-CM

## 2024-07-23 ENCOUNTER — Telehealth (INDEPENDENT_AMBULATORY_CARE_PROVIDER_SITE_OTHER): Admitting: Family

## 2024-07-23 ENCOUNTER — Encounter: Payer: Self-pay | Admitting: Family

## 2024-07-23 ENCOUNTER — Other Ambulatory Visit: Payer: Self-pay

## 2024-07-23 VITALS — Ht 66.0 in | Wt 196.1 lb

## 2024-07-23 DIAGNOSIS — E785 Hyperlipidemia, unspecified: Secondary | ICD-10-CM

## 2024-07-23 DIAGNOSIS — E538 Deficiency of other specified B group vitamins: Secondary | ICD-10-CM

## 2024-07-23 DIAGNOSIS — E559 Vitamin D deficiency, unspecified: Secondary | ICD-10-CM | POA: Diagnosis not present

## 2024-07-23 MED ORDER — CHOLECALCIFEROL 1.25 MG (50000 UT) PO CAPS
50000.0000 [IU] | ORAL_CAPSULE | ORAL | 0 refills | Status: AC
  Filled 2024-07-23: qty 8, 56d supply, fill #0

## 2024-07-23 NOTE — Assessment & Plan Note (Signed)
 Discussed family history.  The 10-year ASCVD risk score (Danney Bungert DK, et al., 2019) is: 2.7% Goal LDL < 100.  Pending CT calcium  score.  Continue Crestor  5 mg consistently and we will recheck lipid panel at follow-up

## 2024-07-23 NOTE — Patient Instructions (Addendum)
 B12 is low which can cause neuropathy ( numbness, tingling), memory changes, and fatigue.  Optimal level of B12 is greater than 400.   I have ordered more labs to evaluate for this. Please call to schedule these non fasting labs in the next couple of weeks.    I would also like you to start over the counter b12 1000mcg pill daily.  You may find this over-the-counter. Sublingual or dissolving tablets work very well as well. You may find 1000 mcg sublingual as well.     I would like to repeat check B12 in a couple months to see if it has responded.   Your vitamin D  level is low.  You may start prescription for vitamin  D 50000 units by mouth ONCE weekly for 8 weeks only. I have sent this to your pharmacy. After 8 weeks, you MAY stop this dose and resume over the counter cholecalciferol  800 units daily.  I will not refill vitamin d  50,000 units until vitamin D  is rechecked as vitamin d  is toxic if taking more than needed.   Please call our office to a follow up visit with me and we can recheck vitamin d  level in a few months.   If you would like to further stratify your cardiovascular risk or consider medication management, I would first recommend obtaining a CT calcium  score.  Information included below.  Please let me know if you would like to order and I will place.    Once I have ordered, you may schedule on your own by calling [phone] ( [facility] [address] in [place] ).   An estimate of cost is $150-200 out-of-pocket as not covered by insurance.    Below an article from [facility] regarding the test.   https://www.[facility].org/imaging/exams-and-procedures/screenings/cardiac-ct#:~:text=A%20cardiac%20CT%20calcium%20score,arteries%20can%20cause%20heart%20attacks.   Exams We Offer: Cardiac CT Calcium  Score  Knowing your score could save your life. A cardiac CT calcium  score, also known as a coronary calcium  scan, is a quick, convenient and noninvasive way of  evaluating the amount of calcified (hard) plaque in your heart vessels. The level of calcium  equates to the extent of plaque build-up in your arteries. Plaque in the arteries can cause heart attacks.  The radiologist reads the images and sends your doctor a report with a calcium  score. Patients with higher scores have a greater risk for a heart attack, heart disease or stroke. Knowing your score can help your doctor decide on blood pressure and cholesterol goals that will minimize your risk as much as possible.  The [employer] of Cardiology found that Coronary artery calcification (CAC) is an excellent cardiovascular disease risk marker and can help guide the decision to use cholesterol reducing medications such as statins. A negative calcium  score may reduce the need for statins in otherwise eligible patients.  The exam takes less than 10 minutes, is painless and does not require any IV or oral contrast.  Who should get a Cardiac CT Calcium  Score: Middle age adults at intermediate risk of heart disease Family history of heart disease Borderline high cholesterol, high blood pressure or diabetes Overweight or physical inactivity Uncertain about taking daily preventive medical therapy

## 2024-07-23 NOTE — Progress Notes (Unsigned)
 Virtual Visit via Video Note  I connected with Eric Cunningham on 07/24/2024 at 12:30 PM EST by a video enabled telemedicine application and verified that I am speaking with the correct person using two identifiers. Location patient: home Location provider: work  Persons participating in the virtual visit: patient, provider  I discussed the limitations of evaluation and management by telemedicine and the availability of in person appointments. The patient expressed understanding and agreed to proceed.  HPI: Feels well today No  new complaints Here to discuss labs   He has been taking crestor  5mg  consistently since 06/27/24 Prior too, he was taking inconsistently  B12 180 LDL 144 Vit D 16 Protein S Activity 23  Former smoker  Father h/o TIA   ROS: See pertinent positives and negatives per HPI.  EXAM:  VITALS per patient if applicable: Ht 5' 6 (1.676 m)   Wt 196 lb 1.6 oz (89 kg)   BMI 31.65 kg/m  BP Readings from Last 3 Encounters:  06/27/24 136/84  07/27/22 138/84  05/18/22 134/84   Wt Readings from Last 3 Encounters:  07/23/24 196 lb 1.6 oz (89 kg)  06/27/24 204 lb 6.4 oz (92.7 kg)  07/27/22 211 lb (95.7 kg)    GENERAL: alert, oriented, appears well and in no acute distress  HEENT: atraumatic, conjunttiva clear, no obvious abnormalities on inspection of external nose and ears  NECK: normal movements of the head and neck  LUNGS: on inspection no signs of respiratory distress, breathing rate appears normal, no obvious gross SOB, gasping or wheezing  CV: no obvious cyanosis  MS: moves all visible extremities without noticeable abnormality  PSYCH/NEURO: pleasant and cooperative, no obvious depression or anxiety, speech and thought processing grossly intact  Lab Results  Component Value Date   LDLCALC 144 (H) 06/27/2024    ASSESSMENT AND PLAN: Hyperlipidemia, unspecified hyperlipidemia type Assessment & Plan: Discussed family history.  The 10-year  ASCVD risk score (Jazariah Teall DK, et al., 2019) is: 2.7% Goal LDL < 100.  Pending CT calcium  score.  Continue Crestor  5 mg consistently and we will recheck lipid panel at follow-up  Orders: -     Lipid panel; Future -     Hepatic function panel; Future -     CT CARDIAC SCORING (SELF PAY ONLY); Future  Vitamin D  deficiency -     Cholecalciferol ; Take 1 capsule (50,000 Units total) by mouth once a week for 8 weeks  Dispense: 8 capsule; Refill: 0  B12 deficiency Assessment & Plan: Advised start over-the-counter B12 1000 cc daily.  Pending celiac antibodies, intrinsic factor  Orders: -     Intrinsic Factor Antibodies; Future -     Homocysteine; Future -     Methylmalonic acid, serum; Future -     Anti-parietal antibody; Future -     Celiac Disease Ab Screen w/Rfx; Future     -we discussed possible serious and likely etiologies, options for evaluation and workup, limitations of telemedicine visit vs in person visit, treatment, treatment risks and precautions. Pt prefers to treat via telemedicine empirically rather then risking or undertaking an in person visit at this moment.    I discussed the assessment and treatment plan with the patient. The patient was provided an opportunity to ask questions and all were answered. The patient agreed with the plan and demonstrated an understanding of the instructions.   The patient was advised to call back or seek an in-person evaluation if the symptoms worsen or if the condition fails to  improve as anticipated.  Advised if desired AVS can be mailed or viewed via MyChart if Mychart user.   Rollene Northern, FNP

## 2024-07-24 NOTE — Assessment & Plan Note (Signed)
 Advised start over-the-counter B12 1000 cc daily.  Pending celiac antibodies, intrinsic factor

## 2024-08-09 ENCOUNTER — Other Ambulatory Visit

## 2024-08-30 ENCOUNTER — Other Ambulatory Visit

## 2024-08-30 DIAGNOSIS — E785 Hyperlipidemia, unspecified: Secondary | ICD-10-CM

## 2024-08-30 DIAGNOSIS — E538 Deficiency of other specified B group vitamins: Secondary | ICD-10-CM

## 2024-08-30 LAB — HEPATIC FUNCTION PANEL
ALT: 18 U/L (ref 3–53)
AST: 18 U/L (ref 5–37)
Albumin: 4.5 g/dL (ref 3.5–5.2)
Alkaline Phosphatase: 61 U/L (ref 39–117)
Bilirubin, Direct: 0.1 mg/dL (ref 0.1–0.3)
Total Bilirubin: 0.6 mg/dL (ref 0.2–1.2)
Total Protein: 7 g/dL (ref 6.0–8.3)

## 2024-08-30 LAB — LIPID PANEL
Cholesterol: 186 mg/dL (ref 28–200)
HDL: 78.1 mg/dL
LDL Cholesterol: 96 mg/dL (ref 10–99)
NonHDL: 107.82
Total CHOL/HDL Ratio: 2
Triglycerides: 59 mg/dL (ref 10.0–149.0)
VLDL: 11.8 mg/dL (ref 0.0–40.0)

## 2024-08-30 NOTE — Addendum Note (Signed)
 Addended by: MORGAN LEVORA RAMAN on: 08/30/2024 08:59 AM   Modules accepted: Orders

## 2024-09-24 ENCOUNTER — Encounter: Admitting: Nurse Practitioner

## 2025-06-30 ENCOUNTER — Encounter: Admitting: Family
